# Patient Record
Sex: Female | Born: 1970
Health system: Southern US, Community
[De-identification: ages and names within clinical notes are randomized; demographics above are authoritative.]

## PROBLEM LIST (undated history)

## (undated) DIAGNOSIS — D649 Anemia, unspecified: Secondary | ICD-10-CM

## (undated) DIAGNOSIS — T7840XA Allergy, unspecified, initial encounter: Secondary | ICD-10-CM

## (undated) DIAGNOSIS — Z9109 Other allergy status, other than to drugs and biological substances: Secondary | ICD-10-CM

## (undated) DIAGNOSIS — N200 Calculus of kidney: Secondary | ICD-10-CM

## (undated) DIAGNOSIS — R768 Other specified abnormal immunological findings in serum: Secondary | ICD-10-CM

## (undated) DIAGNOSIS — G43009 Migraine without aura, not intractable, without status migrainosus: Secondary | ICD-10-CM

## (undated) DIAGNOSIS — Z862 Personal history of diseases of the blood and blood-forming organs and certain disorders involving the immune mechanism: Secondary | ICD-10-CM

## (undated) HISTORY — PX: WISDOM TOOTH EXTRACTION: SHX21

## (undated) HISTORY — DX: Migraine without aura, not intractable, without status migrainosus: G43.009

## (undated) HISTORY — DX: Anemia, unspecified: D64.9

## (undated) HISTORY — DX: Other specified abnormal immunological findings in serum: R76.8

## (undated) HISTORY — DX: Calculus of kidney: N20.0

## (undated) HISTORY — DX: Other allergy status, other than to drugs and biological substances: Z91.09

## (undated) HISTORY — DX: Personal history of diseases of the blood and blood-forming organs and certain disorders involving the immune mechanism: Z86.2

---

## 2000-11-26 ENCOUNTER — Other Ambulatory Visit: Admission: RE | Admit: 2000-11-26 | Discharge: 2000-11-26 | Payer: Self-pay | Admitting: *Deleted

## 2001-12-16 ENCOUNTER — Encounter: Payer: Self-pay | Admitting: Internal Medicine

## 2001-12-16 ENCOUNTER — Ambulatory Visit (HOSPITAL_COMMUNITY): Admission: RE | Admit: 2001-12-16 | Discharge: 2001-12-16 | Payer: Self-pay | Admitting: Internal Medicine

## 2006-11-26 ENCOUNTER — Encounter: Admission: RE | Admit: 2006-11-26 | Discharge: 2006-11-26 | Payer: Self-pay | Admitting: Internal Medicine

## 2006-12-26 ENCOUNTER — Encounter: Admission: RE | Admit: 2006-12-26 | Discharge: 2007-02-12 | Payer: Self-pay | Admitting: Internal Medicine

## 2008-01-15 ENCOUNTER — Other Ambulatory Visit: Admission: RE | Admit: 2008-01-15 | Discharge: 2008-01-15 | Payer: Self-pay | Admitting: Obstetrics and Gynecology

## 2011-11-04 ENCOUNTER — Emergency Department (HOSPITAL_BASED_OUTPATIENT_CLINIC_OR_DEPARTMENT_OTHER)
Admission: EM | Admit: 2011-11-04 | Discharge: 2011-11-04 | Disposition: A | Discharge status: Home / Self Care | Payer: BC Managed Care – PPO | Attending: Emergency Medicine | Admitting: Emergency Medicine

## 2011-11-04 ENCOUNTER — Encounter (HOSPITAL_BASED_OUTPATIENT_CLINIC_OR_DEPARTMENT_OTHER): Payer: Self-pay | Admitting: Emergency Medicine

## 2011-11-04 ENCOUNTER — Emergency Department (INDEPENDENT_AMBULATORY_CARE_PROVIDER_SITE_OTHER): Payer: BC Managed Care – PPO

## 2011-11-04 ENCOUNTER — Other Ambulatory Visit: Payer: Self-pay

## 2011-11-04 DIAGNOSIS — R079 Chest pain, unspecified: Secondary | ICD-10-CM

## 2011-11-04 DIAGNOSIS — R071 Chest pain on breathing: Secondary | ICD-10-CM | POA: Insufficient documentation

## 2011-11-04 DIAGNOSIS — R05 Cough: Secondary | ICD-10-CM

## 2011-11-04 DIAGNOSIS — R059 Cough, unspecified: Secondary | ICD-10-CM | POA: Insufficient documentation

## 2011-11-04 DIAGNOSIS — R0789 Other chest pain: Secondary | ICD-10-CM

## 2011-11-04 HISTORY — DX: Allergy, unspecified, initial encounter: T78.40XA

## 2011-11-04 MED ORDER — IBUPROFEN 800 MG PO TABS: 800.0000 mg | ORAL_TABLET | Freq: Three times a day (TID) | ORAL | Status: AC

## 2011-11-04 MED ORDER — HYDROCODONE-HOMATROPINE 5-1.5 MG/5ML PO SYRP: 2.5000 mL | ORAL_SOLUTION | Freq: Four times a day (QID) | ORAL | Status: AC | PRN

## 2011-11-04 NOTE — Discharge Instructions (Signed)
Chest Wall Pain Chest wall pain is pain in or around the bones and muscles of your chest. It may take up to 6 weeks to get better. It may take longer if you must stay physically active in your work and activities.  CAUSES  Chest wall pain may happen on its own. However, it may be caused by:  A viral illness like the flu.   Injury.   Coughing.   Exercise.   Arthritis.   Fibromyalgia.   Shingles.  HOME CARE INSTRUCTIONS   Avoid overtiring physical activity. Try not to strain or perform activities that cause pain. This includes any activities using your chest or your abdominal and side muscles, especially if heavy weights are used.   Put ice on the sore area.   Put ice in a plastic bag.   Place a towel between your skin and the bag.   Leave the ice on for 15 to 20 minutes per hour while awake for the first 2 days.   Only take over-the-counter or prescription medicines for pain, discomfort, or fever as directed by your caregiver.  SEEK IMMEDIATE MEDICAL CARE IF:   Your pain increases, or you are very uncomfortable.   You have a fever.   Your chest pain becomes worse.   You have new, unexplained symptoms.   You have nausea or vomiting.   You feel sweaty or lightheaded.   You have a cough with phlegm (sputum), or you cough up blood.  MAKE SURE YOU:   Understand these instructions.   Will watch your condition.   Will get help right away if you are not doing well or get worse.  Document Released: 07/17/2005 Document Revised: 07/06/2011 Document Reviewed: 03/13/2011 Exit[employer] Patient Information 2012 [employer]. 

## 2011-11-04 NOTE — ED Notes (Signed)
Pt c/o left sided chest pain that started after coughing. Pt states pain is worse with deep breathing. Pt reports nausea, occasional shob and tingling in hands bilaterally

## 2011-11-04 NOTE — ED Provider Notes (Signed)
History     CSN: [id number]  Arrival date & time 11/04/11  4098   First MD Initiated Contact with Patient 11/04/11 0534      Chief Complaint  Patient presents with  . Chest Pain    (Consider location/radiation/quality/duration/timing/severity/associated sxs/prior treatment) HPI The patient presents with 2 days of chest pain.  She notes that the pain began after a coughing spell.  Since onset the pain has been largely present, though it is waxing and waning.  The pain is sharp.  Pain seems to be worse with coughing and deep inspiration.  The pain is nonexertional.  The pain does not radiate.  The pain is focally about the left anterior superior chest.  The patient denies any other chest pain, abdominal pain, dyspnea, lightheadedness, syncope, extremity weakness. The patient is a nonsmoker, has not traveled recently, has no history of blood clots, nor any known coagulopathy. No history of family early cardiac death Past Medical History  Diagnosis Date  . Multiple chemical sensitivity syndrome     History reviewed. No pertinent past surgical history.  No family history on file.  History  Substance Use Topics  . Smoking status: Never Smoker   . Smokeless tobacco: Not on file  . Alcohol Use: No    OB History    Grav Para Term Preterm Abortions TAB SAB Ect Mult Living                  Review of Systems  All other systems reviewed and are negative.    Allergies  Review of patient's allergies indicates no known allergies.  Home Medications   Current Outpatient Rx  Name Route Sig Dispense Refill  . TOPIRAMATE 50 MG PO TABS Oral Take 50 mg by mouth 2 (two) times daily.      BP 129/81  Pulse 84  Temp(Src) 97.7 F (36.5 C) (Oral)  Resp 18  Ht 5\' 3"  (1.6 m)  Wt 157 lb (71.215 kg)  BMI 27.81 kg/m2  SpO2 100%  LMP 10/25/2011  Physical Exam  Nursing note and vitals reviewed. Constitutional: She is oriented to person, place, and time. She appears well-developed and  well-nourished. No distress.  HENT:  Head: Normocephalic and atraumatic.  Eyes: Conjunctivae and EOM are normal.  Cardiovascular: Normal rate and regular rhythm.   Pulmonary/Chest: Effort normal and breath sounds normal. No stridor. No respiratory distress.  Abdominal: She exhibits no distension.  Musculoskeletal: She exhibits no edema.  Neurological: She is alert and oriented to person, place, and time. No cranial nerve deficit.  Skin: Skin is warm and dry.  Psychiatric: She has a normal mood and affect.    ED Course  Procedures (including critical care time)  Labs Reviewed - No data to display No results found.   No diagnosis found.    Date: 11/04/2011  Rate: 73  Rhythm: normal sinus rhythm  QRS Axis: normal  Intervals: normal  ST/T Wave abnormalities: normal  Conduction Disutrbances:none  Narrative Interpretation:   Old EKG Reviewed: none available q waves anteriorly - borderline ecg  MDM  This generally well-appearing 41-year-old female with no cardiac risk factors, nor any thromboembolic risk factors now presents with several days of left anterior chest pain that began after a coughing spell.  The patient's description of pain following a cough is suggestive of intercostal muscle strain, though there is some suspicion for ruptured bleb.  On exam the patient is in no distress, is not hypoxic, tachypneic or tachycardic.  The absence of  any of these findings, the absence of risk factors for ACS or PE is reassuring.  The patient is low probability for either of these entities.  For the reassurance taken from the nonischemic EKG.  The patient's x-ray was unremarkable.  She was discharged in stable condition following a prolonged discussion regarding anti-inflammatory medicines, and the patient's endorsement of chemical sensitivity syndrome.        Gerhard Munch, MD 11/04/11 [phone]

## 2013-01-01 ENCOUNTER — Telehealth: Payer: Self-pay | Admitting: Obstetrics and Gynecology

## 2013-01-01 NOTE — Telephone Encounter (Signed)
Please call and let pt know why she was not scheduled with her regular doctor?

## 2013-01-02 NOTE — Telephone Encounter (Signed)
See note below per Billie Ruddy, RN . Patient aware of appointment change with Dr. Tresa Res.

## 2013-01-02 NOTE — Telephone Encounter (Signed)
Patient is on [facility] scheduled for AEX, not sure why, wants to see Dr Tresa Res.  Appointment with CR 02-11-13 at 1245.

## 2013-02-06 ENCOUNTER — Telehealth: Payer: Self-pay | Admitting: Obstetrics and Gynecology

## 2013-02-06 NOTE — Telephone Encounter (Signed)
Patient needs to have her [facility] renewed her appointment is not until the 16 th . She ran out on Saturday, needs to start back on Sunday

## 2013-02-07 ENCOUNTER — Encounter: Payer: Self-pay | Admitting: Obstetrics and Gynecology

## 2013-02-07 MED ORDER — NORETHINDRONE-ETH ESTRADIOL 0.4-35 MG-MCG PO TABS: 1.0000 | ORAL_TABLET | Freq: Every day | ORAL | Status: DC

## 2013-02-07 NOTE — Addendum Note (Signed)
Addended by: Lorraine Lax on: 02/07/2013 11:16 AM   Modules accepted: Medications

## 2013-02-07 NOTE — Telephone Encounter (Signed)
Left Message Refill Sent to Pharmacy [facility] #1/0rfs

## 2013-02-11 ENCOUNTER — Ambulatory Visit: Payer: Self-pay | Admitting: Obstetrics and Gynecology

## 2013-02-11 ENCOUNTER — Ambulatory Visit: Payer: Self-pay | Admitting: Nurse Practitioner

## 2013-02-12 ENCOUNTER — Encounter: Payer: Self-pay | Admitting: Obstetrics and Gynecology

## 2013-02-12 ENCOUNTER — Ambulatory Visit (INDEPENDENT_AMBULATORY_CARE_PROVIDER_SITE_OTHER): Payer: BC Managed Care – PPO | Admitting: Obstetrics and Gynecology

## 2013-02-12 VITALS — BP 102/64 | HR 78 | Resp 16 | Ht 63.0 in | Wt 139.0 lb

## 2013-02-12 DIAGNOSIS — R319 Hematuria, unspecified: Secondary | ICD-10-CM

## 2013-02-12 DIAGNOSIS — Z01419 Encounter for gynecological examination (general) (routine) without abnormal findings: Secondary | ICD-10-CM

## 2013-02-12 DIAGNOSIS — Z0000 Encounter for general adult medical examination without abnormal findings: Secondary | ICD-10-CM

## 2013-02-12 LAB — POCT URINALYSIS DIPSTICK
Blood, UA: 50
Protein, UA: NEGATIVE
Urobilinogen, UA: NEGATIVE

## 2013-02-12 MED ORDER — NORETHINDRONE-ETH ESTRADIOL 0.4-35 MG-MCG PO TABS: 1.0000 | ORAL_TABLET | Freq: Every day | ORAL | Status: DC

## 2013-02-12 NOTE — Progress Notes (Signed)
42 y.o.   Married    [place] American   female   G0P0000   here for annual exam.  Menses nl on OC's.  No new health issues.  Is on antibiotics for a cold.  Patient's last menstrual period was 02/04/2013.          Sexually active: yes  The current method of family planning is OCP (estrogen/progesterone).    Exercising:  occ walking Last mammogram:never  Encourage to go, list given Last pap smear:01/26/10 neg History of abnormal pap: no Smoking: never Alcohol: occ mix drink Last colonoscopy:never Last Bone Density:  never Last tetanus shot:less than 10 years Last cholesterol check: not sure  Hgb:          11.7      Urine: 50 blood   Family History  Problem Relation Age of Onset  . Diabetes Maternal Grandmother   . Heart failure Maternal Grandmother   . Ovarian cancer Paternal Grandmother   . Cervical cancer Paternal Grandmother   . Hypertension Mother   . Thyroid disease Mother     hypo    There are no active problems to display for this patient.   Past Medical History  Diagnosis Date  . Multiple chemical sensitivity syndrome   . Environmental allergies     History reviewed. No pertinent past surgical history.  Allergies: Peanuts  Current Outpatient Prescriptions  Medication Sig Dispense Refill  . amoxicillin-clavulanate (AUGMENTIN) 875-125 MG per tablet Take 1 tablet by mouth 2 (two) times daily.      . norethindrone-ethinyl estradiol (OVCON-35, 28,) 0.4-35 MG-MCG tablet Take 1 tablet by mouth daily.  1 Package  0  . topiramate (TOPAMAX) 50 MG tablet Take 50 mg by mouth 2 (two) times daily.      Marland Kitchen zolmitriptan (ZOMIG) 5 MG tablet Take 5 mg by mouth as needed for migraine.       No current facility-administered medications for this visit.    ROS: Pertinent items are noted in HPI.  Social Hx:  Married, no children, not working  Exam:    BP 102/64  Pulse 78  Resp 16  Ht 5\' 3"  (1.6 m)  Wt 139 lb (63.05 kg)  BMI 24.63 kg/m2  LMP 02/04/2013  Ht stable and wt  down 6 pounds from last year Wt Readings from Last 3 Encounters:  02/12/13 139 lb (63.05 kg)  11/04/11 157 lb (71.215 kg)     Ht Readings from Last 3 Encounters:  02/12/13 5\' 3"  (1.6 m)  11/04/11 5\' 3"  (1.6 m)    General appearance: alert, cooperative and appears stated age Head: Normocephalic, without obvious abnormality, atraumatic Neck: no adenopathy, supple, symmetrical, trachea midline and thyroid not enlarged, symmetric, no tenderness/mass/nodules Lungs: clear to auscultation bilaterally Breasts: Inspection negative, No nipple retraction or dimpling, No nipple discharge or bleeding, No axillary or supraclavicular adenopathy, Normal to palpation without dominant masses Heart: regular rate and rhythm Abdomen: soft, non-tender; bowel sounds normal; no masses,  no organomegaly Extremities: extremities normal, atraumatic, no cyanosis or edema Skin: Skin color, texture, turgor normal. No rashes or lesions Lymph nodes: Cervical, supraclavicular, and axillary nodes normal. No abnormal inguinal nodes palpated Neurologic: Grossly normal   Pelvic: External genitalia:  no lesions              Urethra:  normal appearing urethra with no masses, tenderness or lesions              Bartholins and Skenes: normal  Vagina: normal appearing vagina with normal color and discharge, no lesions              Cervix: normal appearance              Pap taken: yes        Bimanual Exam:  Uterus:  uterus is normal size, shape, consistency and nontender                                      Adnexa: normal adnexa in size, nontender and no masses                                      Rectovaginal: Confirms                                      Anus:  normal sphincter tone, no lesions  A: normal gyn exam, OC's     Multiple chemical sensitivities     Recurrent hematuria on urine dipstick with f/u microscopic exams neg - ? Related to her chemical sensitivities?     Migraine HA's on topamax      P: mammogram pap smear counseled on breast self exam, mammography screening, adequate intake of calcium and vitamin D, diet and exercise return annually or prn  Discussed the lower contraceptive effectiveness of her OC while she is on topamax, altho she is only 100mg  per day and the EPIC warning states the interference is more likely with doses of 200 mg per day or more.  I clearly told pt her OC was made less effective by the topamax, but pt thinks she will be taken off her topamax in the next 2 weeks and really wants to stay on it for now.  She and her husband are also not frequently sexually active.  Pt promises she will come back for alternative birth control if she ends up staying on her topamax.      An After Visit Summary was printed and given to the patient.

## 2013-02-12 NOTE — Patient Instructions (Signed)
EXERCISE AND DIET:  We recommended that you start or continue a regular exercise program for good health. Regular exercise means any activity that makes your heart beat faster and makes you sweat.  We recommend exercising at least 30 minutes per day at least 3 days a week, preferably 4 or 5.  We also recommend a diet low in fat and sugar.  Inactivity, poor dietary choices and obesity can cause diabetes, heart attack, stroke, and kidney damage, among others.    ALCOHOL AND SMOKING:  Women should limit their alcohol intake to no more than 7 drinks/beers/glasses of wine (combined, not each!) per week. Moderation of alcohol intake to this level decreases your risk of breast cancer and liver damage. And of course, no recreational drugs are part of a healthy lifestyle.  And absolutely no smoking or even second hand smoke. Most people know smoking can cause heart and lung diseases, but did you know it also contributes to weakening of your bones? Aging of your skin?  Yellowing of your teeth and nails?  CALCIUM AND VITAMIN D:  Adequate intake of calcium and Vitamin D are recommended.  The recommendations for exact amounts of these supplements seem to change often, but generally speaking 600 mg of calcium (either carbonate or citrate) and 800 units of Vitamin D per day seems prudent. Certain women may benefit from higher intake of Vitamin D.  If you are among these women, your doctor will have told you during your visit.    PAP SMEARS:  Pap smears, to check for cervical cancer or precancers,  have traditionally been done yearly, although recent scientific advances have shown that most women can have pap smears less often.  However, every woman still should have a physical exam from her gynecologist every year. It will include a breast check, inspection of the vulva and vagina to check for abnormal growths or skin changes, a visual exam of the cervix, and then an exam to evaluate the size and shape of the uterus and  ovaries.  And after 40 years of age, a rectal exam is indicated to check for rectal cancers. We will also provide age appropriate advice regarding health maintenance, like when you should have certain vaccines, screening for sexually transmitted diseases, bone density testing, colonoscopy, mammograms, etc.   MAMMOGRAMS:  All women over 40 years old should have a yearly mammogram. Many facilities now offer a "3D" mammogram, which may cost around $50 extra out of pocket. If possible,  we recommend you accept the option to have the 3D mammogram performed.  It both reduces the number of women who will be called back for extra views which then turn out to be normal, and it is better than the routine mammogram at detecting truly abnormal areas.       

## 2013-02-13 LAB — URINALYSIS, MICROSCOPIC ONLY

## 2013-02-13 LAB — LIPID PANEL: Cholesterol: 124 mg/dL (ref 0–200)

## 2013-02-17 LAB — IPS PAP TEST WITH HPV

## 2013-02-24 ENCOUNTER — Other Ambulatory Visit: Payer: Self-pay | Admitting: Obstetrics and Gynecology

## 2013-02-24 DIAGNOSIS — Z1231 Encounter for screening mammogram for malignant neoplasm of breast: Secondary | ICD-10-CM

## 2013-02-25 ENCOUNTER — Ambulatory Visit (HOSPITAL_COMMUNITY)
Admission: RE | Admit: 2013-02-25 | Discharge: 2013-02-25 | Disposition: A | Discharge status: Home / Self Care | Payer: BC Managed Care – PPO | Source: Ambulatory Visit | Attending: Obstetrics and Gynecology | Admitting: Obstetrics and Gynecology

## 2013-02-25 DIAGNOSIS — Z1231 Encounter for screening mammogram for malignant neoplasm of breast: Secondary | ICD-10-CM | POA: Insufficient documentation

## 2013-03-24 ENCOUNTER — Other Ambulatory Visit: Payer: Self-pay

## 2013-03-24 MED ORDER — NORETHINDRONE-ETH ESTRADIOL 0.4-35 MG-MCG PO TABS: 1.0000 | ORAL_TABLET | Freq: Every day | ORAL | Status: DC

## 2013-03-24 NOTE — Telephone Encounter (Signed)
Pt had aex 02/12/13 & given rx for 1 yr. Pharmacy is requesting 90 day supply. rx sent for supply with 3 refills

## 2014-02-17 ENCOUNTER — Ambulatory Visit (INDEPENDENT_AMBULATORY_CARE_PROVIDER_SITE_OTHER): Payer: BC Managed Care – PPO | Admitting: Certified Nurse Midwife

## 2014-02-17 ENCOUNTER — Encounter: Payer: Self-pay | Admitting: Certified Nurse Midwife

## 2014-02-17 ENCOUNTER — Ambulatory Visit: Payer: BC Managed Care – PPO | Admitting: Certified Nurse Midwife

## 2014-02-17 VITALS — BP 114/70 | HR 68 | Resp 16 | Ht 63.25 in | Wt 144.0 lb

## 2014-02-17 DIAGNOSIS — R319 Hematuria, unspecified: Secondary | ICD-10-CM

## 2014-02-17 DIAGNOSIS — Z309 Encounter for contraceptive management, unspecified: Secondary | ICD-10-CM

## 2014-02-17 DIAGNOSIS — Z0000 Encounter for general adult medical examination without abnormal findings: Secondary | ICD-10-CM

## 2014-02-17 DIAGNOSIS — Z01419 Encounter for gynecological examination (general) (routine) without abnormal findings: Secondary | ICD-10-CM

## 2014-02-17 LAB — COMPREHENSIVE METABOLIC PANEL
ALK PHOS: 31 U/L — AB (ref 39–117)
ALT: 13 U/L (ref 0–35)
AST: 13 U/L (ref 0–37)
Albumin: 3.6 g/dL (ref 3.5–5.2)
BILIRUBIN TOTAL: 0.5 mg/dL (ref 0.2–1.2)
BUN: 8 mg/dL (ref 6–23)
CO2: 21 mEq/L (ref 19–32)
Calcium: 8.9 mg/dL (ref 8.4–10.5)
Chloride: 110 mEq/L (ref 96–112)
Creat: 0.68 mg/dL (ref 0.50–1.10)
GLUCOSE: 87 mg/dL (ref 70–99)
Potassium: 3.7 mEq/L (ref 3.5–5.3)
SODIUM: 140 meq/L (ref 135–145)
Total Protein: 6.2 g/dL (ref 6.0–8.3)

## 2014-02-17 LAB — LIPID PANEL
CHOL/HDL RATIO: 2.2 ratio
CHOLESTEROL: 111 mg/dL (ref 0–200)
HDL: 51 mg/dL (ref >39)
LDL CALC: 51 mg/dL (ref 0–99)
TRIGLYCERIDES: 47 mg/dL (ref <150)
VLDL: 9 mg/dL (ref 0–40)

## 2014-02-17 LAB — POCT URINALYSIS DIPSTICK
BILIRUBIN UA: NEGATIVE
Glucose, UA: NEGATIVE
Ketones, UA: NEGATIVE
Leukocytes, UA: NEGATIVE
NITRITE UA: NEGATIVE
PH UA: 5
PROTEIN UA: NEGATIVE
Urobilinogen, UA: NEGATIVE

## 2014-02-17 LAB — HEMOGLOBIN, FINGERSTICK: Hemoglobin, fingerstick: 11.9 g/dL — ABNORMAL LOW (ref 12.0–16.0)

## 2014-02-17 MED ORDER — NORETHINDRONE-ETH ESTRADIOL 0.4-35 MG-MCG PO TABS: 1.0000 | ORAL_TABLET | Freq: Every day | ORAL | Status: DC

## 2014-02-17 NOTE — Patient Instructions (Signed)

## 2014-02-17 NOTE — Progress Notes (Signed)
43 y.o. G0P0000 Married African American Fe here for annual exam. Periods normal, no issues, except for spotting mid cycle this past month, small amount for 3 days. Patient aware of Topamax use with OCP and absorption concerns. Denies pain with intercourse or change in vaginal discharge. Patient denies any urinary frequency, pain or urgency or blood in urine. History of microscopic hematuria, but no urology work up. Patient earlier this year had MRI for back pain which proved negative. Patient fasted for labs today. Sees PCP for medication management only. Patient has noticed her bowel movements are not always regular and will have some cramping or ? Gas when occurs. No blood in stool or diarrhea. No other issues.  Patient's last menstrual period was 02/03/2014.          Sexually active: Yes.    The current method of family planning is OCP (estrogen/progesterone).    Exercising: Yes.    some walking Smoker:  no  Health Maintenance: Pap: 02-12-13 neg HPV HR neg MMG:  02-25-13 breast density category b, birads category 1: neg Colonoscopy: none BMD:   none TDaP: 2007 Labs: Poct urine-rbc 2+, Hgb-11.9 Self breast exam: done occ   reports that she has never smoked. She has never used smokeless tobacco. She reports that she does not drink alcohol or use illicit drugs.  Past Medical History  Diagnosis Date  . Multiple chemical sensitivity syndrome   . Environmental allergies   . Anemia     Past Surgical History  Procedure Laterality Date  . Wisdom tooth extraction      Current Outpatient Prescriptions  Medication Sig Dispense Refill  . montelukast (SINGULAIR) 10 MG tablet Take 10 mg by mouth. Takes 1/2 at hs      . norethindrone-ethinyl estradiol (OVCON-35, 28,) 0.4-35 MG-MCG tablet Take 1 tablet by mouth daily.  3 Package  3  . topiramate (TOPAMAX) 50 MG tablet Take 50 mg by mouth 2 (two) times daily.      Marland Kitchen zolmitriptan (ZOMIG) 5 MG tablet Take 5 mg by mouth as needed for migraine.        No current facility-administered medications for this visit.    Family History  Problem Relation Age of Onset  . Diabetes Maternal Grandmother   . Heart failure Maternal Grandmother   . Ovarian cancer Paternal Grandmother   . Cervical cancer Paternal Grandmother   . Hypertension Mother   . Thyroid disease Mother     hypo    ROS:  Pertinent items are noted in HPI.  Otherwise, a comprehensive ROS was negative.  Exam:   BP 114/70  Pulse 68  Resp 16  Ht 5' 3.25" (1.607 m)  Wt 144 lb (65.318 kg)  BMI 25.29 kg/m2  LMP 02/03/2014 Height: 5' 3.25" (160.7 cm)  Ht Readings from Last 3 Encounters:  02/17/14 5' 3.25" (1.607 m)  02/12/13 5\' 3"  (1.6 m)  11/04/11 5\' 3"  (1.6 m)    General appearance: alert, cooperative and appears stated age Head: Normocephalic, without obvious abnormality, atraumatic Neck: no adenopathy, supple, symmetrical, trachea midline and thyroid normal to inspection and palpation and non-palpable Lungs: clear to auscultation bilaterally Breasts: normal appearance, no masses or tenderness, No nipple retraction or dimpling, No nipple discharge or bleeding, No axillary or supraclavicular adenopathy Heart: regular rate and rhythm Abdomen: soft, non-tender; no masses,  no organomegaly Extremities: extremities normal, atraumatic, no cyanosis or edema Skin: Skin color, texture, turgor normal. No rashes or lesions Lymph nodes: Cervical, supraclavicular, and axillary nodes normal.  No abnormal inguinal nodes palpated Neurologic: Grossly normal   Pelvic: External genitalia:  no lesions              Urethra:  normal appearing urethra with no masses, tenderness or lesions              Bartholin's and Skene's: normal                 Vagina: normal appearing vagina with normal color and discharge, no lesions              Cervix: normal, nontender              Pap taken: No. Bimanual Exam:  Uterus:  normal size, contour, position, consistency, mobility, non-tender and  anteverted              Adnexa: normal adnexa and no mass, fullness, tenderness               Rectovaginal: Confirms               Anus:  normal sphincter tone, no lesions  A:  Well Woman with normal exam  Contraception OCP desired  R/O UTI 2+ RBC  History of back pain with negative MRI  Early 2015.  History of migraine with Topamax use.  P:   Reviewed health and wellness pertinent to exam  Rx [facility] see order, Discussed decrease in effectiveness when taken with Topamax. Patient aware but has been on for several years and has not had problems with. Discussed spotting can sometimes be a result of decrease absorption or other problems Patient will advise if continues to occur.  Aware of UTI symptoms and will advise if occurs. Discussed Urology evaluation due to history of hematuria. Will await labs, patient agreeable to referral.  Lab Urine micro/culture  Pap smear not taken today  Labs, Lipid panel , CMP, TSH, Vit. D  counseled on breast self exam, mammography screening, adequate intake of calcium and vitamin D, diet and exercise  return annually or prn  An After Visit Summary was printed and given to the patient.

## 2014-02-17 NOTE — Progress Notes (Signed)
Note reviewed, agree with plan.  Ellanora Rayborn, MD  

## 2014-02-18 LAB — URINE CULTURE: Colony Count: 70000

## 2014-02-18 LAB — URINALYSIS, MICROSCOPIC ONLY
Bacteria, UA: NONE SEEN
CRYSTALS: NONE SEEN
Casts: NONE SEEN

## 2014-02-18 LAB — TSH: TSH: 0.675 u[IU]/mL (ref 0.350–4.500)

## 2014-02-18 LAB — VITAMIN D 25 HYDROXY (VIT D DEFICIENCY, FRACTURES): Vit D, 25-Hydroxy: 40 ng/mL (ref 30–89)

## 2014-02-19 ENCOUNTER — Telehealth: Payer: Self-pay | Admitting: *Deleted

## 2014-02-19 NOTE — Telephone Encounter (Signed)
Returning a call to Kaylee Simon °

## 2014-02-19 NOTE — Telephone Encounter (Signed)
LM for pt to call back re: Lab results from 02/17/14

## 2014-02-20 NOTE — Telephone Encounter (Signed)
Patient notified see result note 

## 2014-02-24 ENCOUNTER — Telehealth: Payer: Self-pay | Admitting: Emergency Medicine

## 2014-02-24 DIAGNOSIS — R319 Hematuria, unspecified: Secondary | ICD-10-CM

## 2014-02-24 NOTE — Telephone Encounter (Signed)
Message copied by Joeseph AmorFAST, Kyri Shader L on Tue Feb 24, 2014 11:20 AM ------      Message from: Verner CholLEONARD, DEBORAH S      Created: Tue Feb 24, 2014  7:45 AM       [facility] is who I usually refer to, she will need to look or call insurance to see who is on her Network in [place].      Can we help her with this? ------

## 2014-02-24 NOTE — Telephone Encounter (Signed)
I placed referral for [facility].   Patient has a [employer] PPO plan.   Patient will need to call [employer] to ensure the provider is participating in her network.   Spoke with patient and advised she will need to call [employer] number on the back of her card to ensure what providers are covered in this area. She would like referral to [facility] since this what Verner Choleborah S. Leonard CNM has recommended. Patient will ensure she has coverage after making the appointment.   Routing to Tayl[place]abrina for referral.

## 2014-03-03 NOTE — Telephone Encounter (Signed)
Pt says nurse was going to set her up an appointment with an oncologist.

## 2014-03-04 NOTE — Telephone Encounter (Signed)
Referral sent to [facility] 03/03/14. [facility], [facility] do not have her scheduled yet.  Called patient to advise. Advised as soon as we get her appointment she will be called. She is agreeable.

## 2014-03-10 NOTE — Telephone Encounter (Signed)
Patient has appointment with [facility] for 03/23/14. Message left to return call [facility] at [phone] to ensure she is aware of appointment.

## 2015-02-04 ENCOUNTER — Other Ambulatory Visit: Payer: Self-pay | Admitting: Certified Nurse Midwife

## 2015-02-04 NOTE — Telephone Encounter (Signed)
Medication refill request: Balziva  Last AEX:  02/17/14 with DL  Next AEX: 4/09/817/27/16 with DL Last MMG (if hormonal medication request): 02/26/13 bi-rads 1; negative Refill authorized: #3 packs/0 rfs

## 2015-02-24 ENCOUNTER — Encounter: Payer: Self-pay | Admitting: Certified Nurse Midwife

## 2015-02-24 ENCOUNTER — Ambulatory Visit (INDEPENDENT_AMBULATORY_CARE_PROVIDER_SITE_OTHER): Payer: BLUE CROSS/BLUE SHIELD | Admitting: Certified Nurse Midwife

## 2015-02-24 VITALS — BP 98/62 | HR 68 | Resp 16 | Ht 63.25 in | Wt 143.0 lb

## 2015-02-24 DIAGNOSIS — Z01419 Encounter for gynecological examination (general) (routine) without abnormal findings: Secondary | ICD-10-CM | POA: Diagnosis not present

## 2015-02-24 DIAGNOSIS — Z124 Encounter for screening for malignant neoplasm of cervix: Secondary | ICD-10-CM | POA: Diagnosis not present

## 2015-02-24 DIAGNOSIS — Z0000 Encounter for general adult medical examination without abnormal findings: Secondary | ICD-10-CM | POA: Diagnosis not present

## 2015-02-24 DIAGNOSIS — Z3041 Encounter for surveillance of contraceptive pills: Secondary | ICD-10-CM | POA: Diagnosis not present

## 2015-02-24 LAB — COMPREHENSIVE METABOLIC PANEL
ALBUMIN: 3.8 g/dL (ref 3.6–5.1)
ALT: 11 U/L (ref 6–29)
AST: 13 U/L (ref 10–30)
Alkaline Phosphatase: 29 U/L — ABNORMAL LOW (ref 33–115)
BUN: 11 mg/dL (ref 7–25)
CALCIUM: 8.9 mg/dL (ref 8.6–10.2)
CO2: 21 mEq/L (ref 20–31)
Chloride: 110 mEq/L (ref 98–110)
Creat: 0.7 mg/dL (ref 0.50–1.10)
Glucose, Bld: 77 mg/dL (ref 65–99)
Potassium: 4.1 mEq/L (ref 3.5–5.3)
SODIUM: 139 meq/L (ref 135–146)
TOTAL PROTEIN: 6.5 g/dL (ref 6.1–8.1)
Total Bilirubin: 0.6 mg/dL (ref 0.2–1.2)

## 2015-02-24 LAB — CBC
HEMATOCRIT: 39.6 % (ref 36.0–46.0)
Hemoglobin: 12.9 g/dL (ref 12.0–15.0)
MCH: 28.2 pg (ref 26.0–34.0)
MCHC: 32.6 g/dL (ref 30.0–36.0)
MCV: 86.5 fL (ref 78.0–100.0)
MPV: 9.8 fL (ref 8.6–12.4)
Platelets: 306 10*3/uL (ref 150–400)
RBC: 4.58 MIL/uL (ref 3.87–5.11)
RDW: 13.9 % (ref 11.5–15.5)
WBC: 5.9 10*3/uL (ref 4.0–10.5)

## 2015-02-24 LAB — TSH: TSH: 0.606 u[IU]/mL (ref 0.350–4.500)

## 2015-02-24 MED ORDER — NORETHINDRONE-ETH ESTRADIOL 0.4-35 MG-MCG PO TABS: 1.0000 | ORAL_TABLET | Freq: Every day | ORAL | Status: DC

## 2015-02-24 NOTE — Progress Notes (Signed)
44 y.o. G0P0000 Married  African American Fe here for annual exam. Periods normal,no issues. Contraception working well. Has been in therapy for 2 frozen shoulders, improving. Also continues with multi-chemical responses with skin hives, but also has improved. Desires screening labs. No other health issues today.  Patient's last menstrual period was 01/03/2015.          Sexually active: Yes.    The current method of family planning is OCP (estrogen/progesterone).    Exercising: Yes.    walking Smoker:  no  Health Maintenance: Pap:  02-12-13 neg HPV HR neg MMG: 02-25-13 category b density,birads 1:neg Colonoscopy: none BMD:   none TDaP:  2007 Labs: hgb-12.5 Self breast exam: done monthly   reports that she has never smoked. She has never used smokeless tobacco. She reports that she does not drink alcohol or use illicit drugs.  Past Medical History  Diagnosis Date  . Multiple chemical sensitivity syndrome   . Environmental allergies   . Anemia     Past Surgical History  Procedure Laterality Date  . Wisdom tooth extraction      Current Outpatient Prescriptions  Medication Sig Dispense Refill  . BALZIVA 0.4-35 MG-MCG tablet TAKE 1 TABLET BY MOUTH DAILY 84 tablet 0  . Cyanocobalamin (VITAMIN B 12 PO) Take by mouth daily.    . montelukast (SINGULAIR) 10 MG tablet Take 10 mg by mouth. Takes 1/2 at hs    . topiramate (TOPAMAX) 50 MG tablet Take 50 mg by mouth 2 (two) times daily.    Marland Kitchen zolmitriptan (ZOMIG) 5 MG tablet Take 5 mg by mouth as needed for migraine.     No current facility-administered medications for this visit.    Family History  Problem Relation Age of Onset  . Diabetes Maternal Grandmother   . Heart failure Maternal Grandmother   . Ovarian cancer Paternal Grandmother   . Cervical cancer Paternal Grandmother   . Hypertension Mother   . Thyroid disease Mother     hypo    ROS:  Pertinent items are noted in HPI.  Otherwise, a comprehensive ROS was  negative.  Exam:   BP 98/62 mmHg  Pulse 68  Resp 16  Ht 5' 3.25" (1.607 m)  Wt 143 lb (64.864 kg)  BMI 25.12 kg/m2  LMP 01/03/2015 Height: 5' 3.25" (160.7 cm) Ht Readings from Last 3 Encounters:  02/24/15 5' 3.25" (1.607 m)  02/17/14 5' 3.25" (1.607 m)  02/12/13  (1.6 m)    General appearance: alert, cooperative and appears stated age Head: Normocephalic, without obvious abnormality, atraumatic Neck: no adenopathy, supple, symmetrical, trachea midline and thyroid normal to inspection and palpation Lungs: clear to auscultation bilaterally Breasts: normal appearance, no masses or tenderness, No nipple retraction or dimpling, No nipple discharge or bleeding, No axillary or supraclavicular adenopathy Heart: regular rate and rhythm Abdomen: soft, non-tender; no masses,  no organomegaly Extremities: extremities normal, atraumatic, no cyanosis or edema Skin: Skin color, texture, turgor normal. No rashes or lesions Lymph nodes: Cervical, supraclavicular, and axillary nodes normal. No abnormal inguinal nodes palpated Neurologic: Grossly normal   Pelvic: External genitalia:  no lesions              Urethra:  normal appearing urethra with no masses, tenderness or lesions              Bartholin's and Skene's: normal                 Vagina: normal appearing vagina with normal color  and discharge, no lesions              Cervix: normal,non tender,no lesions              Pap taken: Yes.   Bimanual Exam:  Uterus:  normal size, contour, position, consistency, mobility, non-tender              Adnexa: normal adnexa and no mass, fullness, tenderness               Rectovaginal: Confirms               Anus:  normal sphincter tone, no lesions  Chaperone present: Yes  A:  Well Woman with normal exam  Contraception desires OCP  Multichemical response history, under good control with PCP  Screening labs  Mammogram due will schedule  P:   Reviewed health and wellness pertinent to  exam  Rx Blaiziva see order  Continue follow up as indicated  Lab:TSH, CMP, CBC, Vitamin d  Pap smear taken with HPVHR   counseled on breast self exam, mammography screening, use and side effects of OCP's, adequate intake of calcium and vitamin D, diet and exercise  return annually or prn  An After Visit Summary was printed and given to the patient.  d

## 2015-02-24 NOTE — Patient Instructions (Signed)

## 2015-02-25 ENCOUNTER — Other Ambulatory Visit: Payer: Self-pay | Admitting: Certified Nurse Midwife

## 2015-02-25 DIAGNOSIS — Z1231 Encounter for screening mammogram for malignant neoplasm of breast: Secondary | ICD-10-CM

## 2015-02-25 LAB — VITAMIN D 25 HYDROXY (VIT D DEFICIENCY, FRACTURES): Vit D, 25-Hydroxy: 33 ng/mL (ref 30–100)

## 2015-02-25 NOTE — Progress Notes (Signed)
Reviewed personally.  M. Suzanne Braeson Rupe, MD.  

## 2015-02-28 LAB — IPS PAP TEST WITH HPV

## 2015-03-01 ENCOUNTER — Ambulatory Visit (HOSPITAL_COMMUNITY)
Admission: RE | Admit: 2015-03-01 | Discharge: 2015-03-01 | Disposition: A | Discharge status: Home / Self Care | Payer: BLUE CROSS/BLUE SHIELD | Source: Ambulatory Visit | Attending: Certified Nurse Midwife | Admitting: Certified Nurse Midwife

## 2015-03-01 DIAGNOSIS — Z1231 Encounter for screening mammogram for malignant neoplasm of breast: Secondary | ICD-10-CM | POA: Insufficient documentation

## 2015-04-24 ENCOUNTER — Other Ambulatory Visit: Payer: Self-pay | Admitting: Certified Nurse Midwife

## 2015-04-26 NOTE — Telephone Encounter (Signed)
Medication refill request: Balziva Last AEX:  02-24-15  Next AEX: 7-28 -17 Last MMG (if hormonal medication request): 03-01-15 WNL  Refill authorized: please advise

## 2015-10-21 DIAGNOSIS — R519 Headache, unspecified: Secondary | ICD-10-CM | POA: Insufficient documentation

## 2015-10-21 DIAGNOSIS — R51 Headache: Secondary | ICD-10-CM

## 2015-10-21 DIAGNOSIS — T7840XA Allergy, unspecified, initial encounter: Secondary | ICD-10-CM | POA: Insufficient documentation

## 2015-10-21 DIAGNOSIS — G43009 Migraine without aura, not intractable, without status migrainosus: Secondary | ICD-10-CM | POA: Insufficient documentation

## 2015-10-21 DIAGNOSIS — M542 Cervicalgia: Secondary | ICD-10-CM | POA: Insufficient documentation

## 2016-02-25 ENCOUNTER — Encounter: Payer: Self-pay | Admitting: Certified Nurse Midwife

## 2016-02-25 ENCOUNTER — Ambulatory Visit (INDEPENDENT_AMBULATORY_CARE_PROVIDER_SITE_OTHER): Payer: BLUE CROSS/BLUE SHIELD | Admitting: Certified Nurse Midwife

## 2016-02-25 ENCOUNTER — Other Ambulatory Visit: Payer: Self-pay | Admitting: Certified Nurse Midwife

## 2016-02-25 VITALS — BP 100/64 | HR 68 | Resp 16 | Ht 63.25 in | Wt 145.0 lb

## 2016-02-25 DIAGNOSIS — Z01419 Encounter for gynecological examination (general) (routine) without abnormal findings: Secondary | ICD-10-CM | POA: Diagnosis not present

## 2016-02-25 DIAGNOSIS — Z3041 Encounter for surveillance of contraceptive pills: Secondary | ICD-10-CM

## 2016-02-25 DIAGNOSIS — Z0000 Encounter for general adult medical examination without abnormal findings: Secondary | ICD-10-CM

## 2016-02-25 DIAGNOSIS — N390 Urinary tract infection, site not specified: Secondary | ICD-10-CM

## 2016-02-25 LAB — POCT URINALYSIS DIPSTICK
BILIRUBIN UA: NEGATIVE
GLUCOSE UA: NEGATIVE
Ketones, UA: NEGATIVE
NITRITE UA: NEGATIVE
Protein, UA: NEGATIVE
UROBILINOGEN UA: NEGATIVE
pH, UA: 5

## 2016-02-25 LAB — HEMOGLOBIN, FINGERSTICK: HEMOGLOBIN, FINGERSTICK: 12.7 g/dL (ref 12.0–16.0)

## 2016-02-25 MED ORDER — NORETHINDRONE-ETH ESTRADIOL 0.4-35 MG-MCG PO TABS: 1.0000 | ORAL_TABLET | Freq: Every day | ORAL | 4 refills | Status: DC

## 2016-02-25 MED ORDER — NITROFURANTOIN MONOHYD MACRO 100 MG PO CAPS: 100.0000 mg | ORAL_CAPSULE | Freq: Two times a day (BID) | ORAL | 0 refills | Status: DC

## 2016-02-25 MED ORDER — PHENAZOPYRIDINE HCL 100 MG PO TABS: 100.0000 mg | ORAL_TABLET | Freq: Three times a day (TID) | ORAL | 0 refills | Status: DC | PRN

## 2016-02-25 NOTE — Patient Instructions (Signed)
EXERCISE AND DIET:  We recommended that you start or continue a regular exercise program for good health. Regular exercise means any activity that makes your heart beat faster and makes you sweat.  We recommend exercising at least 30 minutes per day at least 3 days a week, preferably 4 or 5.  We also recommend a diet low in fat and sugar.  Inactivity, poor dietary choices and obesity can cause diabetes, heart attack, stroke, and kidney damage, among others.    ALCOHOL AND SMOKING:  Women should limit their alcohol intake to no more than 7 drinks/beers/glasses of wine (combined, not each!) per week. Moderation of alcohol intake to this level decreases your risk of breast cancer and liver damage. And of course, no recreational drugs are part of a healthy lifestyle.  And absolutely no smoking or even second hand smoke. Most people know smoking can cause heart and lung diseases, but did you know it also contributes to weakening of your bones? Aging of your skin?  Yellowing of your teeth and nails?  CALCIUM AND VITAMIN D:  Adequate intake of calcium and Vitamin D are recommended.  The recommendations for exact amounts of these supplements seem to change often, but generally speaking 600 mg of calcium (either carbonate or citrate) and 800 units of Vitamin D per day seems prudent. Certain women may benefit from higher intake of Vitamin D.  If you are among these women, your doctor will have told you during your visit.    PAP SMEARS:  Pap smears, to check for cervical cancer or precancers,  have traditionally been done yearly, although recent scientific advances have shown that most women can have pap smears less often.  However, every woman still should have a physical exam from her gynecologist every year. It will include a breast check, inspection of the vulva and vagina to check for abnormal growths or skin changes, a visual exam of the cervix, and then an exam to evaluate the size and shape of the uterus and  ovaries.  And after 44 years of age, a rectal exam is indicated to check for rectal cancers. We will also provide age appropriate advice regarding health maintenance, like when you should have certain vaccines, screening for sexually transmitted diseases, bone density testing, colonoscopy, mammograms, etc.   MAMMOGRAMS:  All women over 40 years old should have a yearly mammogram. Many facilities now offer a "3D" mammogram, which may cost around $50 extra out of pocket. If possible,  we recommend you accept the option to have the 3D mammogram performed.  It both reduces the number of women who will be called back for extra views which then turn out to be normal, and it is better than the routine mammogram at detecting truly abnormal areas.    COLONOSCOPY:  Colonoscopy to screen for colon cancer is recommended for all women at age 50.  We know, you hate the idea of the prep.  We agree, BUT, having colon cancer and not knowing it is worse!!  Colon cancer so often starts as a polyp that can be seen and removed at colonscopy, which can quite literally save your life!  And if your first colonoscopy is normal and you have no family history of colon cancer, most women don't have to have it again for 10 years.  Once every ten years, you can do something that may end up saving your life, right?  We will be happy to help you get it scheduled when you are ready.    Be sure to check your insurance coverage so you understand how much it will cost.  It may be covered as a preventative service at no cost, but you should check your particular policy.     Urinary Tract Infection Urinary tract infections (UTIs) can develop anywhere along your urinary tract. Your urinary tract is your body's drainage system for removing wastes and extra water. Your urinary tract includes two kidneys, two ureters, a bladder, and a urethra. Your kidneys are a pair of bean-shaped organs. Each kidney is about the size of your fist. They are located  below your ribs, one on each side of your spine. CAUSES Infections are caused by microbes, which are microscopic organisms, including fungi, viruses, and bacteria. These organisms are so small that they can only be seen through a microscope. Bacteria are the microbes that most commonly cause UTIs. SYMPTOMS  Symptoms of UTIs may vary by age and gender of the patient and by the location of the infection. Symptoms in young women typically include a frequent and intense urge to urinate and a painful, burning feeling in the bladder or urethra during urination. Older women and men are more likely to be tired, shaky, and weak and have muscle aches and abdominal pain. A fever may mean the infection is in your kidneys. Other symptoms of a kidney infection include pain in your back or sides below the ribs, nausea, and vomiting. DIAGNOSIS To diagnose a UTI, your caregiver will ask you about your symptoms. Your caregiver will also ask you to provide a urine sample. The urine sample will be tested for bacteria and white blood cells. White blood cells are made by your body to help fight infection. TREATMENT  Typically, UTIs can be treated with medication. Because most UTIs are caused by a bacterial infection, they usually can be treated with the use of antibiotics. The choice of antibiotic and length of treatment depend on your symptoms and the type of bacteria causing your infection. HOME CARE INSTRUCTIONS  If you were prescribed antibiotics, take them exactly as your caregiver instructs you. Finish the medication even if you feel better after you have only taken some of the medication.  Drink enough water and fluids to keep your urine clear or pale yellow.  Avoid caffeine, tea, and carbonated beverages. They tend to irritate your bladder.  Empty your bladder often. Avoid holding urine for long periods of time.  Empty your bladder before and after sexual intercourse.  After a bowel movement, women should  cleanse from front to back. Use each tissue only once. SEEK MEDICAL CARE IF:   You have back pain.  You develop a fever.  Your symptoms do not begin to resolve within 3 days. SEEK IMMEDIATE MEDICAL CARE IF:   You have severe back pain or lower abdominal pain.  You develop chills.  You have nausea or vomiting.  You have continued burning or discomfort with urination. MAKE SURE YOU:   Understand these instructions.  Will watch your condition.  Will get help right away if you are not doing well or get worse.   This information is not intended to replace advice given to you by your health care provider. Make sure you discuss any questions you have with your health care provider.   Document Released: 04/26/2005 Document Revised: 04/07/2015 Document Reviewed: 08/25/2011 Elsevier Interactive Patient Education 2016 [employer]  

## 2016-02-25 NOTE — Progress Notes (Addendum)
45 y.o. G0P0000 Married African American Fe here for annual exam. Periods normal, no issues. Contraception working well. Patient had episode of severe pain one week with vomiting, lasted for several days.Severe pain has resolved. Now has urinary frequency and urgency, no pain with urination. No fever or chills. Having some back pain now. Has increased water intake. ? Stone passed. No blood in urine. Urine is slightly darker. Felt may she may have UTI.  Sees Dr. Piedad Climes yearly for labs and aex. Sees Neurologist for management of migraine headaches, no aura. All medication stable.  LMP:02-01-16        Sexually active: Yes.    The current method of family planning is OCP (estrogen/progesterone).    Exercising: No.  exercise Smoker:  no  Health Maintenance: Pap:  02-24-15 neg HPV HR neg MMG:  03-01-15 category c density birads 1:neg Colonoscopy:  5/17 neg  BMD:   none TDaP:  2007 Shingles: no Pneumonia: no Hep C and HIV: not done Labs: poct urine-hgb-12.7, poct urine-rbc tr, wbc tr Self breast exam: done monthly   reports that she has never smoked. She has never used smokeless tobacco. She reports that she does not drink alcohol or use drugs.  Past Medical History:  Diagnosis Date  . Anemia   . Environmental allergies   . Multiple chemical sensitivity syndrome     Past Surgical History:  Procedure Laterality Date  . WISDOM TOOTH EXTRACTION      Current Outpatient Prescriptions  Medication Sig Dispense Refill  . BALZIVA 0.4-35 MG-MCG tablet TAKE 1 TABLET BY MOUTH DAILY 84 tablet 4  . Cyanocobalamin (VITAMIN B 12 PO) Take by mouth daily.    . montelukast (SINGULAIR) 10 MG tablet Take 10 mg by mouth. Takes 1/2 at hs    . topiramate (TOPAMAX) 50 MG tablet Take 50 mg by mouth 2 (two) times daily.    Marland Kitchen zolmitriptan (ZOMIG) 5 MG tablet Take 5 mg by mouth as needed for migraine.     No current facility-administered medications for this visit.     Family History  Problem Relation Age of  Onset  . Diabetes Maternal Grandmother   . Heart failure Maternal Grandmother   . Ovarian cancer Paternal Grandmother   . Cervical cancer Paternal Grandmother   . Hypertension Mother   . Thyroid disease Mother     hypo    ROS:  Pertinent items are noted in HPI.  Otherwise, a comprehensive ROS was negative.  Exam:   There were no vitals taken for this visit.   Ht Readings from Last 3 Encounters:  02/24/15 5' 3.25" (1.607 m)  02/17/14 5' 3.25" (1.607 m)  02/12/13  (1.6 m)    General appearance: alert, cooperative and appears stated age Head: Normocephalic, without obvious abnormality, atraumatic Neck: no adenopathy, supple, symmetrical, trachea midline and thyroid normal to inspection and palpation Lungs: clear to auscultation bilaterally CVAT: negative bilateral Breasts: normal appearance, no masses or tenderness, No nipple retraction or dimpling, No nipple discharge or bleeding, No axillary or supraclavicular adenopathy Heart: regular rate and rhythm Abdomen: soft, non-tender; no masses,  no organomegaly, positive suprapubic Extremities: extremities normal, atraumatic, no cyanosis or edema Skin: Skin color, texture, turgor normal. No rashes or lesions Lymph nodes: Cervical, supraclavicular, and axillary nodes normal. No abnormal inguinal nodes palpated Neurologic: Grossly normal   Pelvic: External genitalia:  no lesions              Urethra:  normal appearing urethra with no masses,  tender, no lesions  Bladder and urethral meatus tender              Bartholin's and Skene's: normal                 Vagina: normal appearing vagina with normal color and discharge, no lesions              Cervix: no bleeding following Pap, no cervical motion tenderness and no lesions              Pap taken: No. Bimanual Exam:  Uterus:  normal size, contour, position, consistency, mobility, non-tender              Adnexa: normal adnexa and no mass, fullness, tenderness                Rectovaginal: Confirms               Anus:  normal sphincter tone, no lesions  Chaperone present: yes  A:  Well Woman with normal exam  Contraception OCP desired  UTI  Migraine headache no aura with management with Neurology  Immunization update  P:   Reviewed health and wellness pertinent to exam  Rx Maryanna Shape see order with instructions  Discussed UTI finding and need for treatment. Warning signs of UTI given. Increase water intake.  Rx Macrobid see order Rx Pyridium see order Lab: Urine micro and culture  Continue follow up with MD as indicated  Will do TDAP at PCP  Pap smear as above not taken   counseled on breast self exam, mammography screening, use and side effects of OCP's, adequate intake of calcium and vitamin D, diet and exercise  return annually or prn  An After Visit Summary was printed and given to the patient.

## 2016-02-26 LAB — URINALYSIS, MICROSCOPIC ONLY
Bacteria, UA: NONE SEEN [HPF]
CASTS: NONE SEEN [LPF]
CRYSTALS: NONE SEEN [HPF]
Yeast: NONE SEEN [HPF]

## 2016-02-27 LAB — URINALYSIS, MICROSCOPIC ONLY

## 2016-02-27 LAB — URINE CULTURE

## 2016-02-28 NOTE — Progress Notes (Signed)
Encounter reviewed Please confirm migraines are without aura. Gertie Exon, MD

## 2016-05-25 DIAGNOSIS — G44209 Tension-type headache, unspecified, not intractable: Secondary | ICD-10-CM | POA: Insufficient documentation

## 2017-02-27 ENCOUNTER — Encounter: Payer: Self-pay | Admitting: Certified Nurse Midwife

## 2017-02-27 ENCOUNTER — Ambulatory Visit (INDEPENDENT_AMBULATORY_CARE_PROVIDER_SITE_OTHER): Payer: BLUE CROSS/BLUE SHIELD | Admitting: Certified Nurse Midwife

## 2017-02-27 ENCOUNTER — Other Ambulatory Visit: Payer: Self-pay | Admitting: Certified Nurse Midwife

## 2017-02-27 ENCOUNTER — Other Ambulatory Visit (HOSPITAL_COMMUNITY)
Admission: RE | Admit: 2017-02-27 | Discharge: 2017-02-27 | Disposition: A | Discharge status: Home / Self Care | Payer: BLUE CROSS/BLUE SHIELD | Source: Ambulatory Visit | Attending: Obstetrics & Gynecology | Admitting: Obstetrics & Gynecology

## 2017-02-27 VITALS — BP 116/80 | HR 64 | Ht 63.25 in | Wt 152.0 lb

## 2017-02-27 DIAGNOSIS — Z124 Encounter for screening for malignant neoplasm of cervix: Secondary | ICD-10-CM | POA: Diagnosis not present

## 2017-02-27 DIAGNOSIS — Z23 Encounter for immunization: Secondary | ICD-10-CM | POA: Diagnosis not present

## 2017-02-27 DIAGNOSIS — Z01419 Encounter for gynecological examination (general) (routine) without abnormal findings: Secondary | ICD-10-CM | POA: Insufficient documentation

## 2017-02-27 DIAGNOSIS — Z3041 Encounter for surveillance of contraceptive pills: Secondary | ICD-10-CM

## 2017-02-27 DIAGNOSIS — Z1231 Encounter for screening mammogram for malignant neoplasm of breast: Secondary | ICD-10-CM

## 2017-02-27 NOTE — Progress Notes (Signed)
46 y.o. G0P0000 Married  African American Fe here for annual exam. Periods for the past few months starts on one day later, with lighter flow. Contraception working well. Stools changing to being as often. Constipation occasional, has changed diet to salads and has stopped all bread use. Has noticed a change in stool since change. Slightly brown orange in color once. Denies black tarry or blood in stools.. Taking gluten out of diet with some change. Eating lots of greens.  Sees Neurology for medication management of Topamax daily and Zomig. No other health issues today.   Patient's last menstrual period was 01/31/2017 (exact date).          Sexually active: Yes.    The current method of family planning is OCP (estrogen/progesterone).    Exercising: Yes.    walking Smoker:  no  Health Maintenance: Pap: 02/24/15, Negative with neg HR HPV  02/12/13, Negative with neg HR HPV History of Abnormal Pap: no MMG: 03/01/15, Density Category C, Bi-Rads 1:  Negative Self Breast exams: yes Colonoscopy: 11/2015, Negative TDaP: 07/31/05 Shingles: none Pneumonia: none Hep C and HIV: not done Labs: if needed   reports that she has never smoked. She has never used smokeless tobacco. She reports that she does not drink alcohol or use drugs.  Past Medical History:  Diagnosis Date  . Anemia   . Environmental allergies   . Multiple chemical sensitivity syndrome     Past Surgical History:  Procedure Laterality Date  . WISDOM TOOTH EXTRACTION      Current Outpatient Prescriptions  Medication Sig Dispense Refill  . Cyanocobalamin (VITAMIN B 12 PO) Take by mouth daily.    . Ergocalciferol (VITAMIN D2) 400 units TABS Take by mouth.    . Misc Natural Products (DANDELION ROOT PO) Take by mouth daily.    . montelukast (SINGULAIR) 10 MG tablet Take 10 mg by mouth. Takes 1/2 at hs    . norethindrone-ethinyl estradiol (BALZIVA) 0.4-35 MG-MCG tablet Take 1 tablet by mouth daily. 84 tablet 4  . topiramate (TOPAMAX)  50 MG tablet Take 50 mg by mouth 2 (two) times daily.    Marland Kitchen. VITAMIN E PO Take by mouth.    . zolmitriptan (ZOMIG) 5 MG tablet Take 5 mg by mouth as needed for migraine.     No current facility-administered medications for this visit.     Family History  Problem Relation Age of Onset  . Diabetes Maternal Grandmother   . Heart failure Maternal Grandmother   . Ovarian cancer Paternal Grandmother   . Cervical cancer Paternal Grandmother   . Hypertension Mother   . Thyroid disease Mother        hypo  . Heart attack Cousin     ROS:  Pertinent items are noted in HPI.  Otherwise, a comprehensive ROS was negative.  Exam:   BP 116/80 (BP Location: Right Arm, Patient Position: Sitting, Cuff Size: Normal)   Pulse 64   Ht 5' 3.25" (1.607 m)   Wt 152 lb (68.9 kg)   LMP 01/31/2017 (Exact Date)   BMI 26.71 kg/m  Height: 5' 3.25" (160.7 cm) Ht Readings from Last 3 Encounters:  02/27/17 5' 3.25" (1.607 m)  02/25/16 5' 3.25" (1.607 m)  02/24/15 5' 3.25" (1.607 m)    General appearance: alert, cooperative and appears stated age Head: Normocephalic, without obvious abnormality, atraumatic Neck: no adenopathy, supple, symmetrical, trachea midline and thyroid normal to inspection and palpation Lungs: clear to auscultation bilaterally Breasts: normal appearance, no masses  or tenderness, No nipple retraction or dimpling, No nipple discharge or bleeding, No axillary or supraclavicular adenopathy Heart: regular rate and rhythm Abdomen: soft, non-tender; no masses,  no organomegaly Extremities: extremities normal, atraumatic, no cyanosis or edema Skin: Skin color, texture, turgor normal. No rashes or lesions Lymph nodes: Cervical, supraclavicular, and axillary nodes normal. No abnormal inguinal nodes palpated Neurologic: Grossly normal   Pelvic: External genitalia:  no lesions              Urethra:  normal appearing urethra with no masses, tenderness or lesions              Bartholin's and  Skene's: normal                 Vagina: normal appearing vagina with normal color and discharge, no lesions              Cervix: no bleeding following Pap, no cervical motion tenderness and no lesions              Pap taken: Yes.   Bimanual Exam:  Uterus:  normal size, contour, position, consistency, mobility, non-tender              Adnexa: normal adnexa and no mass, fullness, tenderness               Rectovaginal: Confirms               Anus:  normal sphincter tone, no lesions  Chaperone present: yes  A:  Well Woman with normal exam  Contraception OCP desired  Migraine without aura with Neurology management  Family history of cervical cancer, late age PGM  Immunization update  P:   Reviewed health and wellness pertinent to exam  Rx Maryanna ShapeBalziva see order with instructions  Reviewed warning signs with migraine and need to advise if occurs  Request TDAP  Pap smear: yes   counseled on breast self exam, mammography screening, use and side effects of OCP's, adequate intake of calcium and vitamin D, diet and exercise  Rv aex, prn  An After Visit Summary was printed and given to the patient.

## 2017-02-27 NOTE — Patient Instructions (Signed)

## 2017-02-28 LAB — CYTOLOGY - PAP: DIAGNOSIS: NEGATIVE

## 2017-03-05 ENCOUNTER — Ambulatory Visit
Admission: RE | Admit: 2017-03-05 | Discharge: 2017-03-05 | Disposition: A | Discharge status: Home / Self Care | Payer: BLUE CROSS/BLUE SHIELD | Source: Ambulatory Visit | Attending: Certified Nurse Midwife | Admitting: Certified Nurse Midwife

## 2017-03-05 DIAGNOSIS — Z1231 Encounter for screening mammogram for malignant neoplasm of breast: Secondary | ICD-10-CM

## 2017-03-06 ENCOUNTER — Telehealth: Payer: Self-pay | Admitting: Certified Nurse Midwife

## 2017-03-06 ENCOUNTER — Other Ambulatory Visit: Payer: Self-pay | Admitting: Certified Nurse Midwife

## 2017-03-06 DIAGNOSIS — Z1231 Encounter for screening mammogram for malignant neoplasm of breast: Secondary | ICD-10-CM

## 2017-03-06 DIAGNOSIS — N6459 Other signs and symptoms in breast: Secondary | ICD-10-CM

## 2017-03-06 NOTE — Telephone Encounter (Signed)
Patient went for mammogram yesterday and they stopped and told her to call Kaylee Simon she needs a diagnostic mammogram.  Patient states to call her if you need additional information.

## 2017-03-06 NOTE — Telephone Encounter (Signed)
Patient called again about the message from this morning

## 2017-03-06 NOTE — Telephone Encounter (Signed)
[facility]oke with the [facility] who states that the patient came in for screening mammogram and mentioned concerns with left breast thickening. Screening was not performed and patient was advised to contact our office for diagnostic imaging orders.  Spoke with patient. Last aex was 02/27/17. Reports she had a breast check and everything was normal. States she received TDAP that day and after felt slight chest discomfort close to her left breast. Has been palpating the area and comparing it to her right side. Feels they are the same, but that she has made the area tender by pressing on it so often. Patient states "I wanted to follow protocol and see what Leota Sauerseborah Leonard CNM wants me to do since I just had a breast check."

## 2017-03-07 ENCOUNTER — Other Ambulatory Visit: Payer: Self-pay | Admitting: Certified Nurse Midwife

## 2017-03-07 DIAGNOSIS — Z1231 Encounter for screening mammogram for malignant neoplasm of breast: Secondary | ICD-10-CM

## 2017-03-07 NOTE — Telephone Encounter (Signed)
Order placed for bilateral 3D screening mammogram at the [facility]. Call to the patient. Advised of recommendations. Patient is agreeable.   Call to the [facility]. Appointment for 3D bilateral mammogram scheduled for 03/13/17 at 8:20 am. Spoke with patient who is agreeable to appointment date and time.  Routing to provider for final review. Patient agreeable to disposition. Will close encounter.

## 2017-03-07 NOTE — Telephone Encounter (Signed)
She should have regular 3 D screening after reading her reply and will agree to diagnostic if screening abnormal

## 2017-03-13 ENCOUNTER — Ambulatory Visit
Admission: RE | Admit: 2017-03-13 | Discharge: 2017-03-13 | Disposition: A | Discharge status: Home / Self Care | Payer: BLUE CROSS/BLUE SHIELD | Source: Ambulatory Visit | Attending: Certified Nurse Midwife | Admitting: Certified Nurse Midwife

## 2017-03-13 DIAGNOSIS — Z1231 Encounter for screening mammogram for malignant neoplasm of breast: Secondary | ICD-10-CM

## 2017-05-16 ENCOUNTER — Other Ambulatory Visit: Payer: Self-pay | Admitting: Certified Nurse Midwife

## 2017-05-16 DIAGNOSIS — Z3041 Encounter for surveillance of contraceptive pills: Secondary | ICD-10-CM

## 2017-05-16 NOTE — Telephone Encounter (Signed)
Medication refill request: OCP Last AEX:  02/27/17 DL Next AEX: 6/9/628/2/19 DL Last MMG (if hormonal medication request): 03/13/17 BIRADS1:neg  Refill authorized: 02/25/16 #[id number]. Today please advise.

## 2018-01-17 ENCOUNTER — Encounter: Payer: Self-pay | Admitting: Obstetrics and Gynecology

## 2018-01-17 ENCOUNTER — Other Ambulatory Visit: Payer: Self-pay | Admitting: Certified Nurse Midwife

## 2018-01-17 DIAGNOSIS — Z3041 Encounter for surveillance of contraceptive pills: Secondary | ICD-10-CM

## 2018-01-17 NOTE — Telephone Encounter (Signed)
Medication refill request: balziva Last AEX:  02-27-17 Next AEX: 03-01-18 Last MMG (if hormonal medication request): 03-13-17 category b density birads 1:neg Refill authorized: pt needs refill to get her to her aex. Please approve.

## 2018-02-08 ENCOUNTER — Other Ambulatory Visit: Payer: Self-pay | Admitting: Certified Nurse Midwife

## 2018-02-08 ENCOUNTER — Telehealth: Payer: Self-pay | Admitting: Certified Nurse Midwife

## 2018-02-08 DIAGNOSIS — Z1231 Encounter for screening mammogram for malignant neoplasm of breast: Secondary | ICD-10-CM

## 2018-02-08 NOTE — Telephone Encounter (Signed)
Spoke with patient. Patient asking if MMG "mandantory" or "recommmended"  Advised patient yearly MMG recommended for screening of breast cancer. Advised will need updated MMG to continue refills of OCP or HRT. Patient verbalizes understanding and is agreeable.   Routing to provider for final review. Patient is agreeable to disposition. Will close encounter.

## 2018-02-08 NOTE — Telephone Encounter (Signed)
Left message to call Mayuri Staples at [phone].  

## 2018-02-08 NOTE — Telephone Encounter (Signed)
Patient called requesting to speak with the nurse. Patient aware a screening mammogram was recommended for this year after her last mammogram. She said, "I want to know if it's mandatory, not just recommended, that I have my mammogram this year."

## 2018-02-28 ENCOUNTER — Encounter

## 2018-02-28 ENCOUNTER — Ambulatory Visit: Payer: BLUE CROSS/BLUE SHIELD | Admitting: Certified Nurse Midwife

## 2018-03-01 ENCOUNTER — Encounter: Payer: Self-pay | Admitting: Certified Nurse Midwife

## 2018-03-01 ENCOUNTER — Encounter

## 2018-03-01 ENCOUNTER — Ambulatory Visit: Payer: BLUE CROSS/BLUE SHIELD | Admitting: Certified Nurse Midwife

## 2018-03-01 VITALS — BP 118/70 | HR 72 | Ht 63.0 in | Wt 150.8 lb

## 2018-03-01 DIAGNOSIS — Z3041 Encounter for surveillance of contraceptive pills: Secondary | ICD-10-CM | POA: Diagnosis not present

## 2018-03-01 DIAGNOSIS — Z01419 Encounter for gynecological examination (general) (routine) without abnormal findings: Secondary | ICD-10-CM | POA: Diagnosis not present

## 2018-03-01 MED ORDER — NORETHINDRONE-ETH ESTRADIOL 0.4-35 MG-MCG PO TABS: 1.0000 | ORAL_TABLET | Freq: Every day | ORAL | 4 refills | Status: DC

## 2018-03-01 NOTE — Patient Instructions (Signed)

## 2018-03-01 NOTE — Progress Notes (Signed)
47 y.o. G0P0000 Married  African American Fe here for annual exam. Periods have been normal, but starting a day late, but had missed a few pills. She is now track. No warning signs with OCP use. Period was normal in amount. History of Migraine no aura ever, continues with Neurology follow up no changes.Sees PCP  [facility]bright yearly, and labs, all normal. No health changes in past year. Planning a vacation one day!  No LMP recorded.          Sexually active: Yes.    The current method of family planning is Rx. Balziva  Exercising: No.   Smoker:  no  Review of Systems  Constitutional: Negative.   Eyes: Negative.   Respiratory: Negative.   Cardiovascular: Positive for leg swelling.  Gastrointestinal: Negative.   Genitourinary: Negative for frequency.       Menstrual cycle change  Musculoskeletal: Negative.   Skin: Positive for itching.  Neurological: Negative.   Endo/Heme/Allergies:       Heat/cold intolerance  Psychiatric/Behavioral: Negative.     Health Maintenance: Pap:  02-24-15 neg HPV HR neg, 02-27-17 neg History of Abnormal Pap: no MMG:  03-13-17 category b density birads 1:neg next appt 02/2018 Self Breast exams: yes Colonoscopy:  2017 neg BMD:   N/A TDaP:  2018 Shingles: N/A Pneumonia: n/a Hep C and HIV:  25 years ago per pt Labs: done by PCP 07/2017  reports that she has never smoked. She has never used smokeless tobacco. She reports that she does not drink alcohol or use drugs.  Past Medical History:  Diagnosis Date  . Anemia   . Environmental allergies   . Migraine without aura   . Multiple chemical sensitivity syndrome     Past Surgical History:  Procedure Laterality Date  . WISDOM TOOTH EXTRACTION      Current Outpatient Medications  Medication Sig Dispense Refill  . BALZIVA 0.4-35 MG-MCG tablet TAKE 1 TABLET BY MOUTH DAILY. 84 tablet 0  . Cyanocobalamin (VITAMIN B 12 PO) Take by mouth daily.    . Ergocalciferol (VITAMIN D2) 400 units TABS Take by mouth.     . Misc Natural Products (DANDELION ROOT PO) Take by mouth daily.    . montelukast (SINGULAIR) 10 MG tablet Take 10 mg by mouth. Takes 1/2 at hs    . topiramate (TOPAMAX) 50 MG tablet Take 50 mg by mouth 2 (two) times daily.    Marland Kitchen. VITAMIN E PO Take by mouth.    . zolmitriptan (ZOMIG) 5 MG tablet Take 5 mg by mouth as needed for migraine.     No current facility-administered medications for this visit.     Family History  Problem Relation Age of Onset  . Diabetes Maternal Grandmother   . Heart failure Maternal Grandmother   . Ovarian cancer Paternal Grandmother   . Cervical cancer Paternal Grandmother   . Hypertension Mother   . Thyroid disease Mother        hypo  . Heart attack Cousin     ROS:  Pertinent items are noted in HPI.  Otherwise, a comprehensive ROS was negative.  Exam:   There were no vitals taken for this visit.   Ht Readings from Last 3 Encounters:  02/27/17 5' 3.25" (1.607 m)  02/25/16 5' 3.25" (1.607 m)  02/24/15 5' 3.25" (1.607 m)    General appearance: alert, cooperative and appears stated age Head: Normocephalic, without obvious abnormality, atraumatic Neck: no adenopathy, supple, symmetrical, trachea midline and thyroid normal to inspection and  palpation Lungs: clear to auscultation bilaterally Breasts: normal appearance, no masses or tenderness, No nipple retraction or dimpling, No nipple discharge or bleeding, No axillary or supraclavicular adenopathy Heart: regular rate and rhythm Abdomen: soft, non-tender; no masses,  no organomegaly Extremities: extremities normal, atraumatic, no cyanosis or edema Skin: Skin color, texture, turgor normal. No rashes or lesions Lymph nodes: Cervical, supraclavicular, and axillary nodes normal. No abnormal inguinal nodes palpated Neurologic: Grossly normal   Pelvic: External genitalia:  no lesions              Urethra:  normal appearing urethra with no masses, tenderness or lesions              Bartholin's and  Skene's: normal                 Vagina: normal appearing vagina with normal color and discharge, no lesions              Cervix: no cervical motion tenderness, no lesions and normal appearance              Pap taken: No. Bimanual Exam:  Uterus:  normal size, contour, position, consistency, mobility, non-tender and anteverted              Adnexa: normal adnexa and no mass, fullness, tenderness               Rectovaginal: Confirms               Anus:  normal sphincter tone, no lesions  Chaperone present: yes  A:  Well Woman with normal exam  Contraception OCP desired  Migraine headache without aura with Neurology management, no change  P:   Reviewed health and wellness pertinent to exam  Reviewed risks/benefits/warning signs with OCP and if migraines change  Or have aura will need to switch to POP. Patient aware.  Rx Maryanna Shape see order with instructions  Continue follow up with PCP and neurology as indicated  Pap smear: no   counseled on breast self exam, mammography screening, use and side effects of OCP's, adequate intake of calcium and vitamin D, diet and exercise  return annually or prn  An After Visit Summary was printed and given to the patient.

## 2018-03-19 ENCOUNTER — Ambulatory Visit
Admission: RE | Admit: 2018-03-19 | Discharge: 2018-03-19 | Disposition: A | Discharge status: Home / Self Care | Payer: BLUE CROSS/BLUE SHIELD | Source: Ambulatory Visit | Attending: Certified Nurse Midwife | Admitting: Certified Nurse Midwife

## 2018-03-19 DIAGNOSIS — Z1231 Encounter for screening mammogram for malignant neoplasm of breast: Secondary | ICD-10-CM

## 2018-04-08 ENCOUNTER — Telehealth: Payer: Self-pay | Admitting: Certified Nurse Midwife

## 2018-04-08 NOTE — Telephone Encounter (Signed)
Left message to call Lenvil Swaim at [phone].  

## 2018-04-08 NOTE — Telephone Encounter (Signed)
Spoke with patient. Patient reports lower right side that goes to her back and urinary frequency for 3 wks.Uses a resuable douche bag when traveling, symptoms started after using it last on vacation. Seeing chiropractor for lower back pain, pain is improving. LMP approximately 2 wks ago, OCP for contraceptive.   Patient states symptoms are exactly the same as when she "was treated 1-2nyrs ago for the same pain". Requesting Rx.   Denies fever/chills, N/V, blood in urine, vaginal d/c or odor.   Recommended OV for further evaluation. Patient declined OV for today with covering provider. OV scheduled for 9/10 at 2pm with Leota Sauers, CNM.   Routing to covering provider for final review. Patient is agreeable to disposition. Will close encounter.   Cc: Leota Sauers, CNM

## 2018-04-08 NOTE — Telephone Encounter (Signed)
Patient stated that she is having the "same problem she was having about a year ago." Patient is experiencing right side pain. Patient is requesting the same medication that was used when she had these symptoms previously. Patient does not remember name of medication. Patient stated that it was "for an infection." Patient can be reached at [phone] until 11:30am and then at [phone] after 11:30am.

## 2018-04-09 ENCOUNTER — Ambulatory Visit (INDEPENDENT_AMBULATORY_CARE_PROVIDER_SITE_OTHER): Payer: BLUE CROSS/BLUE SHIELD | Admitting: Certified Nurse Midwife

## 2018-04-09 ENCOUNTER — Encounter: Payer: Self-pay | Admitting: Certified Nurse Midwife

## 2018-04-09 ENCOUNTER — Other Ambulatory Visit: Payer: Self-pay

## 2018-04-09 VITALS — BP 110/72 | HR 64 | Temp 98.4°F | Resp 16 | Ht 63.0 in | Wt 154.0 lb

## 2018-04-09 DIAGNOSIS — R319 Hematuria, unspecified: Secondary | ICD-10-CM

## 2018-04-09 DIAGNOSIS — N390 Urinary tract infection, site not specified: Secondary | ICD-10-CM

## 2018-04-09 DIAGNOSIS — Z01419 Encounter for gynecological examination (general) (routine) without abnormal findings: Secondary | ICD-10-CM

## 2018-04-09 LAB — POCT URINALYSIS DIPSTICK
Bilirubin, UA: NEGATIVE
Glucose, UA: NEGATIVE
Ketones, UA: NEGATIVE
Leukocytes, UA: NEGATIVE
NITRITE UA: NEGATIVE
PH UA: 5 (ref 5.0–8.0)
PROTEIN UA: NEGATIVE
UROBILINOGEN UA: NEGATIVE U/dL — AB

## 2018-04-09 MED ORDER — PHENAZOPYRIDINE HCL 100 MG PO TABS: 100.0000 mg | ORAL_TABLET | Freq: Three times a day (TID) | ORAL | 0 refills | Status: DC | PRN

## 2018-04-09 MED ORDER — NITROFURANTOIN MONOHYD MACRO 100 MG PO CAPS: ORAL_CAPSULE | ORAL | 0 refills | Status: DC

## 2018-04-09 NOTE — Patient Instructions (Signed)

## 2018-04-09 NOTE — Progress Notes (Signed)
47 y.o. Married [place] American female G0P0000 here with complaint of UTI, with onset  on ? 3 weeks ago.. Patient complaining of urinary frequency/urgency/ and slight pain with urination. Patient denies fever, chills, nausea.Slight back pain. No new personal products. Patient feels not related to sexual activity. Denies any vaginal symptoms.    Contraception is OCP. Patient trying to consume adequate water intake. " I remember this pain,when it occurred before and it was my bladder". Denies excessive soda use or vaginal dryness. No other  Health concerns today  Review of Systems  Constitutional: Negative for fever.       ? Chills, feels cold at times  HENT: Negative.   Respiratory: Negative.   Cardiovascular: Negative.   Gastrointestinal: Positive for abdominal pain. Negative for constipation.       Right lower quadrant pain, has not been emptying bowels as regular. Passing large amounts of gas.  Genitourinary: Positive for frequency, hematuria and urgency. Negative for dysuria.  Musculoskeletal: Positive for back pain.       Low back pain since urgency started.  Skin: Negative.   Neurological: Negative.   Psychiatric/Behavioral: Negative.      Physical Exam  Constitutional: She is oriented to person, place, and time. She appears well-developed and well-nourished.  Abdominal: Soft. Bowel sounds are normal. She exhibits no distension and no mass. There is tenderness in the suprapubic area. There is no rebound, no guarding and no CVA tenderness. No hernia.  Suprapubic only  Genitourinary: Vagina normal and uterus normal. Pelvic exam was performed with patient supine. There is no rash, tenderness, lesion or injury on the right labia. There is no rash, tenderness, lesion or injury on the left labia. Cervix exhibits no motion tenderness and no discharge. Right adnexum displays no mass, no tenderness and no fullness. Left adnexum displays no mass, no tenderness and no fullness.  Genitourinary  Comments:  Bladder,urethra and urethral meatus all tender. Slight redness noted at urethral meatus.  Lymphadenopathy: No inguinal adenopathy noted on the right or left side.       Right: No inguinal adenopathy present.       Left: No inguinal adenopathy present.  Neurological: She is alert and oriented to person, place, and time.  Skin: Skin is warm and dry.  Psychiatric: She has a normal mood and affect. Her behavior is normal. Judgment and thought content normal.    poct urine-rbc 1+ A: UTI Normal pelvic exam  P: Reviewed findings of UTI and need for treatment. Rx: Macrobid see order with instructions Rx Pyridium see order with instructions [facility]:LEXNT micro, culture Reviewed warning signs and symptoms of UTI and need to advise if occurring. Encouraged to limit soda, tea, and coffee and be sure to increase water intake. Questions addressed.   RV prn

## 2018-04-10 LAB — URINALYSIS, MICROSCOPIC ONLY: CASTS: NONE SEEN /LPF

## 2018-04-12 ENCOUNTER — Other Ambulatory Visit: Payer: Self-pay | Admitting: Obstetrics and Gynecology

## 2018-04-12 DIAGNOSIS — Z3041 Encounter for surveillance of contraceptive pills: Secondary | ICD-10-CM

## 2018-04-12 LAB — URINE CULTURE

## 2018-04-16 ENCOUNTER — Telehealth: Payer: Self-pay

## 2018-04-16 NOTE — Telephone Encounter (Signed)
Patient notified of results as written by provider 

## 2018-04-16 NOTE — Telephone Encounter (Signed)
See telephone encounter dated 04-16-18.

## 2018-04-16 NOTE — Telephone Encounter (Signed)
-----   Message from Verner Choleborah S Leonard, CNM sent at 04/13/2018  1:56 PM EDT ----- Notify patient her urine culture showed low colony count so medication should clear.  Patient status?

## 2018-04-16 NOTE — Telephone Encounter (Signed)
Left message to call back  

## 2018-04-16 NOTE — Telephone Encounter (Signed)
Patient returned call

## 2018-04-25 ENCOUNTER — Telehealth: Payer: Self-pay | Admitting: Certified Nurse Midwife

## 2018-04-25 NOTE — Telephone Encounter (Signed)
Reviewed with Leota Sauers, CNM, OV for further evaluation.  Call returned to patient. OV scheduled for 9/27 at 10:45am with Leota Sauers, CNM. ER precautions for worsening symptoms: pain, fever/chills, N/V.   Routing to provider for final review. Patient is agreeable to disposition. Will close encounter.

## 2018-04-25 NOTE — Telephone Encounter (Signed)
Spoke with patient. 9/10 macrobid bid x7 days for UTI. Patient reports symptoms did not completely resolve. Patient reports urgency, right sided flank pain and lower back pain. Has increased water intake and limited caffeine. Denies dysuria, fever/ N/V. "Can't tell if chills, I keep house cool". LMP 04/23/18. Denies vaginal d/c or odor.   Advised will review with Leota Sauers, CNM and return call.  Patient verbalizes understanding.  Leota Sauers, CNM- please advise.

## 2018-04-25 NOTE — Telephone Encounter (Signed)
Patient states pain in right side subsided a little bit, but is still there. Seen on 04/09/18.

## 2018-04-26 ENCOUNTER — Ambulatory Visit: Payer: BLUE CROSS/BLUE SHIELD | Admitting: Certified Nurse Midwife

## 2018-04-26 ENCOUNTER — Encounter: Payer: Self-pay | Admitting: Certified Nurse Midwife

## 2018-04-26 ENCOUNTER — Other Ambulatory Visit: Payer: Self-pay

## 2018-04-26 VITALS — BP 102/64 | HR 68 | Temp 98.7°F | Resp 16 | Ht 63.0 in | Wt 152.0 lb

## 2018-04-26 DIAGNOSIS — M25551 Pain in right hip: Secondary | ICD-10-CM | POA: Diagnosis not present

## 2018-04-26 DIAGNOSIS — R350 Frequency of micturition: Secondary | ICD-10-CM

## 2018-04-26 DIAGNOSIS — M791 Myalgia, unspecified site: Secondary | ICD-10-CM

## 2018-04-26 NOTE — Addendum Note (Signed)
Addended by: Verner Chol on: 04/26/2018 02:37 PM   Modules accepted: Orders

## 2018-04-26 NOTE — Patient Instructions (Signed)
Musculoskeletal Pain  Musculoskeletal pain is muscle and bone aches and pains. This pain can occur in any part of the body.  Follow these instructions at home:  · Only take medicines for pain, discomfort, or fever as told by your health care provider.  · You may continue all activities unless the activities cause more pain. When the pain lessens, slowly resume normal activities. Gradually increase the intensity and duration of the activities or exercise.  · During periods of severe pain, bed rest may be helpful. Lie or sit in any position that is comfortable, but get out of bed and walk around at least every several hours.  · If directed, put ice on the injured area.  ? Put ice in a plastic bag.  ? Place a towel between your skin and the bag.  ? Leave the ice on for 20 minutes, 2-3 times a day.  Contact a health care provider if:  · Your pain is getting worse.  · Your pain is not relieved with medicines.  · You lose function in the area of the pain if the pain is in your arms, legs, or neck.  This information is not intended to replace advice given to you by your health care provider. Make sure you discuss any questions you have with your health care provider.  Document Released: 07/17/2005 Document Revised: 12/28/2015 Document Reviewed: 03/21/2013  Elsevier Interactive Patient Education © 2017 [employer]

## 2018-04-26 NOTE — Progress Notes (Signed)
47 y.o. Married [place] American female G0P0000 here with complaint of still having pain in lower back and  Right hip and urinary frequency since  04/09/18.Marland Kitchen Patient complaining of urinary frequency but no other symptoms. Feels this is because she drinking more fluids.. Patient denies fever, chills, that she is aware of.. Back pain has occurred on right side radiates into hip. Denies fall or injury.  No change in vaginal discharge.. No new personal products. Patient denies any pain after sexual activity.  Contraception is OCP. Patient is drinking adequate water intake daily.   Patient was treated on 04/09/18 with Macrobid and negative urine culture. She feels this is different problem that previous occurrence. Denies upper abdominal pain or change in stools or nausea. No other issues.  Review of Systems  Constitutional: Negative.   HENT:       Headache  Eyes: Negative.   Respiratory: Negative.   Cardiovascular: Positive for chest pain.  Gastrointestinal: Negative.   Genitourinary:       Night urination  Musculoskeletal: Negative.   Skin: Negative.   Neurological: Negative.   Endo/Heme/Allergies:       Bruises  Psychiatric/Behavioral: Negative.     O: Healthy female WDWN Affect: Normal, orientation x 3 Skin : warm and dry CVAT: negative bilateral Abdomen: negative for suprapubic tenderness, soft, non tender, no masses. Points to right hip and low back area as point of pain.  Pelvic exam: External genital area: normal, no lesions Bladder,Urethra tender, Urethral meatus: tender, red Vagina: normal vaginal discharge, normal appearance, period present  Cervix: normal, non tender Uterus:normal,non tender Adnexa: normal non tender, no fullness or masses   A:Normal pelvic exam poct urine-unable to dip. Urine was red from blood(period) No indication of UTI Suspect muscloskeletal  pain  P: Reviewed findings of normal pelvic exam and no indication of discomfort from gyn area and feel she  needs to follow up with PCP regarding hip and back pain. No indication of UTI. Patient given warning signs of abdominal pain and need to seek ER. If occurs.  Lab: Urine culture Reviewed warning signs and symptoms of UTI and need to advise if occurring.   RV prn

## 2018-04-27 LAB — URINE CULTURE

## 2018-11-27 ENCOUNTER — Telehealth: Payer: Self-pay | Admitting: Certified Nurse Midwife

## 2018-11-27 NOTE — Telephone Encounter (Signed)
Patient believes she has a bladder infection. Symptoms began Sunday 11/24/18. Aware she may need to be seen before filling prescription, but would like to discuss using Azo with a nurse.

## 2018-11-27 NOTE — Telephone Encounter (Signed)
Spoke with patient. Patient states that she began having right sided pain on Sunday 11/24/2018. Denies any urinary symptoms, fevers, chills, or lower back pain. Reports pain is constant and is a 6/10. Reports she feels nauseated, feels sick to her stomach, and had 3 soft stools yesterday. Advised patient this could be GI related. Patient does have a PCP provider. Advised patient to seek evaluation with PCP to ensure she can be evaluated for all symptoms. Patient verbalizes understanding and will contact her PCP for scheduling.  Routing to provider and will close encounter.

## 2019-02-10 ENCOUNTER — Other Ambulatory Visit: Payer: Self-pay | Admitting: Certified Nurse Midwife

## 2019-02-10 DIAGNOSIS — Z1231 Encounter for screening mammogram for malignant neoplasm of breast: Secondary | ICD-10-CM

## 2019-02-18 ENCOUNTER — Telehealth: Payer: Self-pay | Admitting: Certified Nurse Midwife

## 2019-02-18 NOTE — Telephone Encounter (Signed)
Left message on voicemail to call and reschedule cancelled appointment. °

## 2019-02-18 NOTE — Telephone Encounter (Signed)
Message not needed. °

## 2019-03-05 ENCOUNTER — Ambulatory Visit: Payer: BLUE CROSS/BLUE SHIELD | Admitting: Certified Nurse Midwife

## 2019-03-07 ENCOUNTER — Ambulatory Visit (INDEPENDENT_AMBULATORY_CARE_PROVIDER_SITE_OTHER): Payer: BC Managed Care – PPO | Admitting: Certified Nurse Midwife

## 2019-03-07 ENCOUNTER — Encounter: Payer: Self-pay | Admitting: Certified Nurse Midwife

## 2019-03-07 ENCOUNTER — Other Ambulatory Visit: Payer: Self-pay

## 2019-03-07 VITALS — BP 114/72 | HR 64 | Temp 97.2°F | Resp 16 | Ht 62.75 in | Wt 163.0 lb

## 2019-03-07 DIAGNOSIS — Z3041 Encounter for surveillance of contraceptive pills: Secondary | ICD-10-CM | POA: Diagnosis not present

## 2019-03-07 DIAGNOSIS — Z01419 Encounter for gynecological examination (general) (routine) without abnormal findings: Secondary | ICD-10-CM | POA: Diagnosis not present

## 2019-03-07 MED ORDER — BALZIVA 0.4-35 MG-MCG PO TABS: 1.0000 | ORAL_TABLET | Freq: Every day | ORAL | 4 refills | Status: DC

## 2019-03-07 NOTE — Progress Notes (Signed)
48 y.o. G0P0000 Married  African American Fe here for annual exam.  Periods normal but changed since new generic from mail order. Contraception OCP no warning signs, just lighter periods. Taking cranberry tablets to prevent UTI and has not had one in the past year!Malvin Johns. Saw neurology for migraines all stable, changed medication and working well. Sees PCP as needed. No other health issues today.  Patient's last menstrual period was 02/26/2019 (exact date).          Sexually active: Yes.    The current method of family planning is OCP (estrogen/progesterone).    Exercising: Yes.    walking, bike, rowing machine Smoker:  no  Review of Systems  Constitutional: Negative.        Cycle changes, weight gain, hot flashes with birth control change  HENT: Negative.   Eyes: Negative.   Respiratory: Negative.   Cardiovascular: Negative.   Gastrointestinal: Negative.   Genitourinary: Negative.   Musculoskeletal: Negative.   Skin: Negative.   Neurological: Negative.   Endo/Heme/Allergies: Negative.   Psychiatric/Behavioral: Negative.     Health Maintenance: Pap:  02-27-17 neg History of Abnormal Pap: no MMG:  03-19-18 category b density birads 1: neg Self Breast exams: yes Colonoscopy:  2017 neg BMD:   none TDaP:  2018 Shingles: no Pneumonia: no Hep C and HIV: neg many yrs ago Labs: if needed.   reports that she has never smoked. She has never used smokeless tobacco. She reports that she does not drink alcohol or use drugs.  Past Medical History:  Diagnosis Date  . Anemia   . Environmental allergies   . Migraine without aura   . Multiple chemical sensitivity syndrome     Past Surgical History:  Procedure Laterality Date  . WISDOM TOOTH EXTRACTION      Current Outpatient Medications  Medication Sig Dispense Refill  . Cyanocobalamin (VITAMIN B 12 PO) Take by mouth daily.    . Ergocalciferol (VITAMIN D2) 400 units TABS Take by mouth.    . Misc Natural Products (DANDELION ROOT PO) Take  by mouth daily.    . montelukast (SINGULAIR) 10 MG tablet Take 10 mg by mouth. Takes 1/2 at hs    . norethindrone-ethinyl estradiol (BALZIVA) 0.4-35 MG-MCG tablet Take 1 tablet by mouth daily. 84 tablet 4  . topiramate (TOPAMAX) 50 MG tablet Take 50 mg by mouth 2 (two) times daily.    Marland Kitchen. VITAMIN E PO Take by mouth.    . zolmitriptan (ZOMIG) 5 MG tablet Take 5 mg by mouth as needed for migraine.     No current facility-administered medications for this visit.     Family History  Problem Relation Age of Onset  . Diabetes Maternal Grandmother   . Heart failure Maternal Grandmother   . Ovarian cancer Paternal Grandmother   . Cervical cancer Paternal Grandmother   . Hypertension Mother   . Thyroid disease Mother        hypo  . Heart attack Cousin     ROS:  Pertinent items are noted in HPI.  Otherwise, a comprehensive ROS was negative.  Exam:   Ht 5' 2.75" (1.594 m)   Wt 163 lb (73.9 kg)   LMP 02/26/2019 (Exact Date)   BMI 29.10 kg/m  Height: 5' 2.75" (159.4 cm) Ht Readings from Last 3 Encounters:  03/07/19 5' 2.75" (1.594 m)  04/26/18 5\' 3"  (1.6 m)  04/09/18 5\' 3"  (1.6 m)    General appearance: alert, cooperative and appears stated age Head: Normocephalic, without  obvious abnormality, atraumatic Neck: no adenopathy, supple, symmetrical, trachea midline and thyroid normal to inspection and palpation Lungs: clear to auscultation bilaterally Breasts: normal appearance, no masses or tenderness, No nipple retraction or dimpling, No nipple discharge or bleeding, No axillary or supraclavicular adenopathy Heart: regular rate and rhythm Abdomen: soft, non-tender; no masses,  no organomegaly Extremities: extremities normal, atraumatic, no cyanosis or edema Skin: Skin color, texture, turgor normal. No rashes or lesions Lymph nodes: Cervical, supraclavicular, and axillary nodes normal. No abnormal inguinal nodes palpated Neurologic: Grossly normal   Pelvic: External genitalia:  no  lesions              Urethra:  normal appearing urethra with no masses, tenderness or lesions              Bartholin's and Skene's: normal                 Vagina: normal appearing vagina with normal color and discharge, no lesions              Cervix: no cervical motion tenderness, no lesions and normal appearance              Pap taken: No. Bimanual Exam:  Uterus:  normal size, contour, position, consistency, mobility, non-tender              Adnexa: normal adnexa and no mass, fullness, tenderness               Rectovaginal: Confirms               Anus:  normal sphincter tone, no lesions  Chaperone present: yes  A:  Well Woman with normal exam  Contraception OCP desired  History of migraine well controlled, no aura with neurology follow up  Weight gain, but working on eating better diet  P:   Reviewed health and wellness pertinent to exam  Risks/benefits/warning signs reviewed, desires Rx  Rx Hulda Humphrey see order with instructions  Continue follow up with MD as indicated.  Pap smear: no  counseled on breast self exam, mammography screening, feminine hygiene, adequate intake of calcium and vitamin D, diet and exercise return annually or prn  An After Visit Summary was printed and given to the patient.

## 2019-03-25 ENCOUNTER — Ambulatory Visit
Admission: RE | Admit: 2019-03-25 | Discharge: 2019-03-25 | Disposition: A | Discharge status: Home / Self Care | Payer: BC Managed Care – PPO | Source: Ambulatory Visit | Attending: Certified Nurse Midwife | Admitting: Certified Nurse Midwife

## 2019-03-25 ENCOUNTER — Other Ambulatory Visit: Payer: Self-pay

## 2019-03-25 DIAGNOSIS — Z1231 Encounter for screening mammogram for malignant neoplasm of breast: Secondary | ICD-10-CM

## 2019-10-22 ENCOUNTER — Encounter: Payer: Self-pay | Admitting: Certified Nurse Midwife

## 2020-01-05 ENCOUNTER — Other Ambulatory Visit: Payer: Self-pay | Admitting: Obstetrics & Gynecology

## 2020-01-05 DIAGNOSIS — Z1231 Encounter for screening mammogram for malignant neoplasm of breast: Secondary | ICD-10-CM

## 2020-01-22 DIAGNOSIS — R3129 Other microscopic hematuria: Secondary | ICD-10-CM | POA: Insufficient documentation

## 2020-03-08 NOTE — Progress Notes (Signed)
49 y.o. G0P0000 Married [place] or [place] American female here for annual exam.  She had issue with kidney stones this year.  She is being followed by Dr. Orlinda Blalock at [facility].  H/o migraines.  Never had an aura.  Followed by Dr. Maple Hudson, Neurology at [facility].    She is on OCPs, Balziva 0.4/35.  D/w pt lowering her estrogen dosage in her OCP.    PCP:  Burnett Kanaris, PA-C.  Blood work done 07/2017 and 12/23/2019.  Patient's last menstrual period was 03/03/2020 (exact date).          Sexually active: Yes.    The current method of family planning is OCP (estrogen/progesterone).    Exercising: No.  exercise Smoker:  no  Health Maintenance: Pap:  02-27-17 neg History of abnormal Pap:  no MMG:  03-26-2019 category b density birads 1:neg, already scheduled for 03/26/2020 Colonoscopy:  11/2015 neg, done with Dr. Dierdre Searles at [facility] BMD:   none TDaP:  2018 Pneumonia vaccine(s):  no Shingrix:   no Hep C testing: neg many yrs ago Screening Labs: with Dr. Piedad Climes   reports that she has never smoked. She has never used smokeless tobacco. She reports that she does not drink alcohol and does not use drugs.  Past Medical History:  Diagnosis Date  . Anemia   . Environmental allergies   . Kidney stones   . Migraine without aura   . Multiple chemical sensitivity syndrome     Past Surgical History:  Procedure Laterality Date  . WISDOM TOOTH EXTRACTION      Current Outpatient Medications  Medication Sig Dispense Refill  . Cyanocobalamin (VITAMIN B 12 PO) Take by mouth daily.    . frovatriptan (FROVA) 2.5 MG tablet Take 2.5 mg by mouth as needed for migraine. If recurs, may repeat after 2 hours. Max of 3 tabs in 24 hours.    . montelukast (SINGULAIR) 10 MG tablet Take 10 mg by mouth. Takes 1/2 at hs    . norethindrone-ethinyl estradiol (BALZIVA) 0.4-35 MG-MCG tablet Take 1 tablet by mouth daily. 84 tablet 4  . topiramate (TOPAMAX) 50 MG tablet Take 50 mg by mouth 2 (two) times daily.     No current  facility-administered medications for this visit.    Family History  Problem Relation Age of Onset  . Diabetes Maternal Grandmother   . Heart failure Maternal Grandmother   . Ovarian cancer Paternal Grandmother   . Cervical cancer Paternal Grandmother   . Hypertension Mother   . Thyroid disease Mother        hypo  . Heart attack Cousin   . Breast cancer Neg Hx     Review of Systems  Constitutional: Negative.   HENT: Negative.   Eyes: Negative.   Respiratory: Negative.   Cardiovascular: Negative.   Gastrointestinal: Negative.   Endocrine: Negative.   Genitourinary: Negative.   Musculoskeletal: Negative.   Skin: Negative.   Allergic/Immunologic: Negative.   Neurological: Negative.   Hematological: Negative.   Psychiatric/Behavioral: Negative.     Exam:   BP 120/76   Pulse 68   Resp 16   Ht 5' 2.75" (1.594 m)   Wt 168 lb (76.2 kg)   LMP 03/03/2020 (Exact Date)   BMI 30.00 kg/m   Height: 5' 2.75" (159.4 cm)  General appearance: alert, cooperative and appears stated age Head: Normocephalic, without obvious abnormality, atraumatic Neck: no adenopathy, supple, symmetrical, trachea midline and thyroid normal to inspection and palpation Lungs: clear to auscultation bilaterally Breasts: normal  appearance, no masses or tenderness Heart: regular rate and rhythm Abdomen: soft, non-tender; bowel sounds normal; no masses,  no organomegaly Extremities: extremities normal, atraumatic, no cyanosis or edema Skin: Skin color, texture, turgor normal. No rashes or lesions Lymph nodes: Cervical, supraclavicular, and axillary nodes normal. No abnormal inguinal nodes palpated Neurologic: Grossly normal   Pelvic: External genitalia:  Left inner labia majora nevus measuring 3 x 67mm              Urethra:  normal appearing urethra with no masses, tenderness or lesions              Bartholins and Skenes: normal                 Vagina: normal appearing vagina with normal color and  discharge, no lesions              Cervix: no lesions              Pap taken: Yes.   Bimanual Exam:  Uterus:  normal size, contour, position, consistency, mobility, non-tender              Adnexa: normal adnexa and no mass, fullness, tenderness               Rectovaginal: Confirms               Anus:  normal sphincter tone, no lesions  Chaperone, Delton Coombes, CMA, was present for exam.  A:  Well Woman with normal exam H/o multiple chemical sensitivity syndrome, diagnosed by Dr. Tenny Craw, Allergy/Immunology Migraine without aura, followed by Dr. Maple Hudson at [facility] On OCPs for contraception Left inner labia majora nevus  P:   Mammogram guidelines reviewed.  Has appt scheduled. pap smear with HR HPV obtained today RF for OCPs Maryanna Shape) Lab work done with [facility] Colonoscopy due 2027 Vaccines reviewed.  She is planning on having the Covid vaccination done after her MMG. return annually or prn

## 2020-03-10 ENCOUNTER — Ambulatory Visit: Payer: BC Managed Care – PPO | Admitting: Certified Nurse Midwife

## 2020-03-15 ENCOUNTER — Other Ambulatory Visit: Payer: Self-pay

## 2020-03-15 ENCOUNTER — Ambulatory Visit: Payer: BC Managed Care – PPO | Admitting: Obstetrics & Gynecology

## 2020-03-15 ENCOUNTER — Other Ambulatory Visit (HOSPITAL_COMMUNITY)
Admission: RE | Admit: 2020-03-15 | Discharge: 2020-03-15 | Disposition: A | Discharge status: Home / Self Care | Payer: BC Managed Care – PPO | Source: Ambulatory Visit | Attending: Obstetrics & Gynecology | Admitting: Obstetrics & Gynecology

## 2020-03-15 ENCOUNTER — Encounter: Payer: Self-pay | Admitting: Obstetrics & Gynecology

## 2020-03-15 VITALS — BP 120/76 | HR 68 | Resp 16 | Ht 62.75 in | Wt 168.0 lb

## 2020-03-15 DIAGNOSIS — Z01419 Encounter for gynecological examination (general) (routine) without abnormal findings: Secondary | ICD-10-CM

## 2020-03-15 DIAGNOSIS — Z124 Encounter for screening for malignant neoplasm of cervix: Secondary | ICD-10-CM | POA: Insufficient documentation

## 2020-03-15 DIAGNOSIS — Z3041 Encounter for surveillance of contraceptive pills: Secondary | ICD-10-CM

## 2020-03-15 MED ORDER — BALZIVA 0.4-35 MG-MCG PO TABS: 1.0000 | ORAL_TABLET | Freq: Every day | ORAL | 3 refills | Status: DC

## 2020-03-15 NOTE — Patient Instructions (Signed)
Dr. Drue Novel and Esperanza Richters at [facility]  Abbe Amsterdam, MD, or Angela Adam PA at [facility]

## 2020-03-16 LAB — CYTOLOGY - PAP
Comment: NEGATIVE
Diagnosis: NEGATIVE
High risk HPV: NEGATIVE

## 2020-03-26 ENCOUNTER — Ambulatory Visit
Admission: RE | Admit: 2020-03-26 | Discharge: 2020-03-26 | Disposition: A | Discharge status: Home / Self Care | Payer: BC Managed Care – PPO | Source: Ambulatory Visit | Attending: Obstetrics & Gynecology | Admitting: Obstetrics & Gynecology

## 2020-03-26 ENCOUNTER — Other Ambulatory Visit: Payer: Self-pay

## 2020-03-26 DIAGNOSIS — Z1231 Encounter for screening mammogram for malignant neoplasm of breast: Secondary | ICD-10-CM

## 2020-03-27 ENCOUNTER — Ambulatory Visit: Payer: BC Managed Care – PPO | Attending: Internal Medicine

## 2020-03-27 DIAGNOSIS — Z23 Encounter for immunization: Secondary | ICD-10-CM

## 2020-03-27 NOTE — Progress Notes (Signed)
° °  Covid-19 Vaccination Clinic  Name:  Adrianna Dudas    MRN: [id number] DOB: 1971/04/04  03/27/2020  Ms. Selner was observed post Covid-19 immunization for 15 minutes without incident. She was provided with Vaccine Information Sheet and instruction to access the V-Safe system.   Ms. Casas was instructed to call 911 with any severe reactions post vaccine:  Difficulty breathing   Swelling of face and throat   A fast heartbeat   A bad rash all over body   Dizziness and weakness   Immunizations Administered    Name Date Dose VIS Date Route   Pfizer COVID-19 Vaccine 03/27/2020 11:35 AM 0.3 mL 09/24/2018 Intramuscular   Manufacturer: [employer], [employer]   Lot: [id number]   NDC: [address]-0

## 2020-03-30 ENCOUNTER — Other Ambulatory Visit: Payer: Self-pay | Admitting: Obstetrics & Gynecology

## 2020-03-30 DIAGNOSIS — R928 Other abnormal and inconclusive findings on diagnostic imaging of breast: Secondary | ICD-10-CM

## 2020-04-09 ENCOUNTER — Ambulatory Visit
Admission: RE | Admit: 2020-04-09 | Discharge: 2020-04-09 | Disposition: A | Discharge status: Home / Self Care | Payer: BC Managed Care – PPO | Source: Ambulatory Visit | Attending: Obstetrics & Gynecology | Admitting: Obstetrics & Gynecology

## 2020-04-09 ENCOUNTER — Other Ambulatory Visit: Payer: Self-pay | Admitting: Obstetrics & Gynecology

## 2020-04-09 ENCOUNTER — Other Ambulatory Visit: Payer: Self-pay

## 2020-04-09 DIAGNOSIS — N6001 Solitary cyst of right breast: Secondary | ICD-10-CM

## 2020-04-09 DIAGNOSIS — R928 Other abnormal and inconclusive findings on diagnostic imaging of breast: Secondary | ICD-10-CM

## 2020-04-19 ENCOUNTER — Ambulatory Visit: Payer: BC Managed Care – PPO | Attending: Internal Medicine

## 2020-04-19 DIAGNOSIS — Z23 Encounter for immunization: Secondary | ICD-10-CM

## 2020-04-19 NOTE — Progress Notes (Signed)
   Covid-19 Vaccination Clinic  Name:  Kaylee Simon    MRN: [id number] DOB: [birth year]  04/19/2020  Kaylee Simon was observed post Covid-19 immunization for 15 minutes without incident. She was provided with Vaccine Information Sheet and instruction to access the V-Safe system. Vaccinated by Fanny Bien.  Kaylee Simon was instructed to call 911 with any severe reactions post vaccine: Marland Kitchen Difficulty breathing  . Swelling of face and throat  . A fast heartbeat  . A bad rash all over body  . Dizziness and weakness   Immunizations Administered    Name Date Dose VIS Date Route   Pfizer COVID-19 Vaccine 04/19/2020 10:47 AM 0.3 mL 09/24/2018 Intramuscular   Manufacturer: [employer], [employer]   Lot: [id number]   NDC: [id number]

## 2020-04-23 MED FILL — PFIZER-BIONTECH COVID-19 VA: 30 | 1 days supply | Qty: 0 | Fill #0

## 2020-05-09 ENCOUNTER — Emergency Department (HOSPITAL_BASED_OUTPATIENT_CLINIC_OR_DEPARTMENT_OTHER): Payer: BC Managed Care – PPO

## 2020-05-09 ENCOUNTER — Emergency Department (HOSPITAL_BASED_OUTPATIENT_CLINIC_OR_DEPARTMENT_OTHER)
Admission: EM | Admit: 2020-05-09 | Discharge: 2020-05-09 | Disposition: A | Discharge status: Home / Self Care | Payer: BC Managed Care – PPO | Attending: Emergency Medicine | Admitting: Emergency Medicine

## 2020-05-09 ENCOUNTER — Other Ambulatory Visit: Payer: Self-pay

## 2020-05-09 ENCOUNTER — Encounter (HOSPITAL_BASED_OUTPATIENT_CLINIC_OR_DEPARTMENT_OTHER): Payer: Self-pay | Admitting: Emergency Medicine

## 2020-05-09 DIAGNOSIS — R072 Precordial pain: Secondary | ICD-10-CM | POA: Insufficient documentation

## 2020-05-09 DIAGNOSIS — Z91010 Allergy to peanuts: Secondary | ICD-10-CM | POA: Diagnosis not present

## 2020-05-09 LAB — BASIC METABOLIC PANEL
Anion gap: 9 (ref 5–15)
BUN: 11 mg/dL (ref 6–20)
CO2: 20 mmol/L — ABNORMAL LOW (ref 22–32)
Calcium: 8.6 mg/dL — ABNORMAL LOW (ref 8.9–10.3)
Chloride: 109 mmol/L (ref 98–111)
Creatinine, Ser: 0.76 mg/dL (ref 0.44–1.00)
GFR, Estimated: >60 mL/min (ref >60)
Glucose, Bld: 95 mg/dL (ref 70–99)
Potassium: 3.5 mmol/L (ref 3.5–5.1)
Sodium: 138 mmol/L (ref 135–145)

## 2020-05-09 LAB — CBC WITH DIFFERENTIAL/PLATELET
Abs Immature Granulocytes: 0.02 10*3/uL (ref 0.00–0.07)
Basophils Absolute: 0 10*3/uL (ref 0.0–0.1)
Basophils Relative: 0 %
Eosinophils Absolute: 0.2 10*3/uL (ref 0.0–0.5)
Eosinophils Relative: 2 %
HCT: 41.1 % (ref 36.0–46.0)
Hemoglobin: 13 g/dL (ref 12.0–15.0)
Immature Granulocytes: 0 %
Lymphocytes Relative: 37 %
Lymphs Abs: 2.7 10*3/uL (ref 0.7–4.0)
MCH: 27.8 pg (ref 26.0–34.0)
MCHC: 31.6 g/dL (ref 30.0–36.0)
MCV: 88 fL (ref 80.0–100.0)
Monocytes Absolute: 0.7 10*3/uL (ref 0.1–1.0)
Monocytes Relative: 9 %
Neutro Abs: 3.7 10*3/uL (ref 1.7–7.7)
Neutrophils Relative %: 52 %
Platelets: 325 10*3/uL (ref 150–400)
RBC: 4.67 MIL/uL (ref 3.87–5.11)
RDW: 13.3 % (ref 11.5–15.5)
WBC: 7.3 10*3/uL (ref 4.0–10.5)
nRBC: 0 % (ref 0.0–0.2)

## 2020-05-09 LAB — TROPONIN I (HIGH SENSITIVITY): Troponin I (High Sensitivity): <2 ng/L (ref <18)

## 2020-05-09 LAB — PREGNANCY, URINE: Preg Test, Ur: NEGATIVE

## 2020-05-09 MED ORDER — IOHEXOL 350 MG/ML SOLN
100.0000 mL | Freq: Once | INTRAVENOUS | Status: AC | PRN
  Administered 2020-05-09: 100 mL via INTRAVENOUS

## 2020-05-09 MED ORDER — IBUPROFEN 800 MG PO TABS: 800.0000 mg | ORAL_TABLET | Freq: Three times a day (TID) | ORAL | 0 refills | Status: DC

## 2020-05-09 MED ORDER — ALUM & MAG HYDROXIDE-SIMETH 200-200-20 MG/5ML PO SUSP
30.0000 mL | Freq: Once | ORAL | Status: AC
  Administered 2020-05-09: 30 mL via ORAL
  Filled 2020-05-09: qty 30

## 2020-05-09 MED ORDER — ACETAMINOPHEN 500 MG PO TABS
1000.0000 mg | ORAL_TABLET | Freq: Once | ORAL | Status: AC
  Administered 2020-05-09: 1000 mg via ORAL
  Filled 2020-05-09: qty 2

## 2020-05-09 MED ORDER — KETOROLAC TROMETHAMINE 30 MG/ML IJ SOLN
30.0000 mg | Freq: Once | INTRAMUSCULAR | Status: AC
  Administered 2020-05-09: 30 mg via INTRAVENOUS
  Filled 2020-05-09: qty 1

## 2020-05-09 NOTE — ED Provider Notes (Signed)
7:10 AM Care assumed from Dr. Nicanor Alcon. At time of transfer of care, plan is to follow-up on CT PE study. If PE study is reassuring, dissipate discharge home with plans to follow-up with a PCP.  8:39 AM CT scan just resulted.  No evidence of pulmonary embolism or other concerning cause of her discomfort.  Physiologic pleural effusion was seen which we discussed.  No evidence of pericardial effusion or other concerning findings.  Patient and family were informed of these findings and they agree with plan for discharge home with PCP follow-up.  She understand return precautions and was discharged in good condition.  Clinical Impression: 1. Precordial pain     Disposition: Discharge  Condition: Good  I have discussed the results, Dx and Tx plan with the pt(& family if present). He/she/they expressed understanding and agree(s) with the plan. Discharge instructions discussed at great length. Strict return precautions discussed and pt &/or family have verbalized understanding of the instructions. No further questions at time of discharge.    New Prescriptions   IBUPROFEN (ADVIL) 800 MG TABLET    Take 1 tablet (800 mg total) by mouth 3 (three) times daily.    Follow Up: Gillian Scarce [address] [place] [address] [phone]     [facility] [address] [id number] [address] [id number] [address] [phone]           Rylah Fukuda, Canary Brim, MD 05/09/20 [phone]

## 2020-05-09 NOTE — Discharge Instructions (Signed)
Your work-up today was overall reassuring.  Please follow-up with your primary doctor and use the medication to help with your discomfort.  Please rest and stay hydrated.  If any symptoms change or worsen, please return to the nearest emergency department.

## 2020-05-09 NOTE — ED Triage Notes (Signed)
Pt c/o anterior chest pain that started one day ago. Has not taken anything for pain. States pain is sharp and constant. Denies n/v or sob. Denies any long car rides.

## 2020-05-09 NOTE — ED Provider Notes (Addendum)
[facility] Provider Note   CSN: [id number] Arrival date & time: 05/09/20  0440     History No chief complaint on file.   Kaylee Simon is a 49 y.o. female.  The history is provided by the patient.  Chest Pain Pain location:  Substernal area Pain quality: sharp   Pain severity:  Moderate Onset quality:  Gradual Duration:  1 day Timing:  Constant Progression:  Unchanged Chronicity:  New Context: at rest   Relieved by:  Nothing Worsened by:  Nothing Ineffective treatments:  None tried Associated symptoms: no abdominal pain, no AICD problem, no altered mental status, no anorexia, no anxiety, no back pain, no claudication, no cough, no diaphoresis, no dizziness, no dysphagia, no fatigue, no fever, no headache, no heartburn, no lower extremity edema, no nausea, no near-syncope, no numbness, no orthopnea, no palpitations, no PND, no shortness of breath, no syncope, no vomiting and no weakness   Risk factors: birth control   Risk factors: no aortic disease, no hypertension, no immobilization, no prior DVT/PE and no surgery   Patient with h/o of migraine presents with sharp SSCP that is worse with inspiration and constant x 24 hours.  No leg pain nor swelling.  No travel.  Patient is on an OCP.  No exertional pain, no SOB, no DOE.  No n/v/d.  Patient denies cough or fevers.       Past Medical History:  Diagnosis Date  . History of anemia   . Kidney stones   . Migraine without aura   . Multiple chemical sensitivity syndrome     Patient Active Problem List   Diagnosis Date Noted  . Tension-type headache, not intractable 05/25/2016  . Neck pain 10/21/2015  . Migraine without aura and responsive to treatment 10/21/2015  . Migraine without aura and without status migrainosus, not intractable 10/21/2015  . Occipital headache 10/21/2015  . Sensitivity to medication 10/21/2015    Past Surgical History:  Procedure Laterality Date  . WISDOM TOOTH  EXTRACTION       OB History    Gravida  0   Para  0   Term  0   Preterm  0   AB  0   Living  0     SAB  0   TAB  0   Ectopic  0   Multiple  0   Live Births  0           Family History  Problem Relation Age of Onset  . Diabetes Maternal Grandmother   . Heart failure Maternal Grandmother   . Ovarian cancer Paternal Grandmother   . Cervical cancer Paternal Grandmother   . Hypertension Mother   . Thyroid disease Mother        hypo  . Heart attack Cousin   . Breast cancer Neg Hx     Social History   Tobacco Use  . Smoking status: Never Smoker  . Smokeless tobacco: Never Used  Vaping Use  . Vaping Use: Never used  Substance Use Topics  . Alcohol use: No    Alcohol/week: 0.0 standard drinks  . Drug use: No    Home Medications Prior to Admission medications   Medication Sig Start Date End Date Taking? Authorizing Provider  Cyanocobalamin (VITAMIN B 12 PO) Take by mouth daily.    [provider]  frovatriptan (FROVA) 2.5 MG tablet Take 2.5 mg by mouth as needed for migraine. If recurs, may repeat after 2 hours. Max of  3 tabs in 24 hours.    [provider]  montelukast (SINGULAIR) 10 MG tablet Take 10 mg by mouth. Takes 1/2 at hs    [provider]  norethindrone-ethinyl estradiol (BALZIVA) 0.4-35 MG-MCG tablet Take 1 tablet by mouth daily. 03/15/20   Jerene Bears, MD  topiramate (TOPAMAX) 50 MG tablet Take 50 mg by mouth 2 (two) times daily.    [provider]    Allergies    Peanuts [peanut oil] and Prunus persica  Review of Systems   Review of Systems  Constitutional: Negative for diaphoresis, fatigue and fever.  HENT: Negative for trouble swallowing.   Eyes: Negative for visual disturbance.  Respiratory: Negative for cough and shortness of breath.   Cardiovascular: Positive for chest pain. Negative for palpitations, orthopnea, claudication, leg swelling, syncope, PND and near-syncope.  Gastrointestinal:  Negative for abdominal pain, anorexia, heartburn, nausea and vomiting.  Genitourinary: Negative for difficulty urinating.  Musculoskeletal: Negative for back pain.  Skin: Negative for rash.  Neurological: Negative for dizziness, weakness, numbness and headaches.  Psychiatric/Behavioral: Negative for agitation.  All other systems reviewed and are negative.   Physical Exam Updated Vital Signs There were no vitals taken for this visit.  Physical Exam Vitals and nursing note reviewed.  Constitutional:      General: She is not in acute distress.    Appearance: Normal appearance.  HENT:     Head: Normocephalic and atraumatic.     Nose: Nose normal.  Eyes:     Conjunctiva/sclera: Conjunctivae normal.     Pupils: Pupils are equal, round, and reactive to light.  Cardiovascular:     Rate and Rhythm: Normal rate and regular rhythm.     Pulses: Normal pulses.     Heart sounds: Normal heart sounds.  Pulmonary:     Effort: Pulmonary effort is normal.     Breath sounds: Normal breath sounds.  Abdominal:     General: Abdomen is flat. Bowel sounds are normal.     Palpations: Abdomen is soft.     Tenderness: There is no abdominal tenderness. There is no guarding or rebound.  Musculoskeletal:        General: Normal range of motion.     Cervical back: Normal range of motion and neck supple.  Skin:    General: Skin is warm and dry.     Capillary Refill: Capillary refill takes less than 2 seconds.  Neurological:     General: No focal deficit present.     Mental Status: She is alert and oriented to person, place, and time.     Deep Tendon Reflexes: Reflexes normal.  Psychiatric:        Mood and Affect: Mood normal.        Behavior: Behavior normal.     ED Results / Procedures / Treatments   Labs (all labs ordered are listed, but only abnormal results are displayed) Results for orders placed or performed during the hospital encounter of 05/09/20  CBC with Differential/Platelet  Result  Value Ref Range   WBC 7.3 4.0 - 10.5 K/uL   RBC 4.67 3.87 - 5.11 MIL/uL   Hemoglobin 13.0 12.0 - 15.0 g/dL   HCT 53.6 36 - 46 %   MCV 88.0 80.0 - 100.0 fL   MCH 27.8 26.0 - 34.0 pg   MCHC 31.6 30.0 - 36.0 g/dL   RDW 14.4 31.5 - 40.0 %   Platelets 325 150 - 400 K/uL   nRBC 0.0 0.0 - 0.2 %  Neutrophils Relative % 52 %   Neutro Abs 3.7 1.7 - 7.7 K/uL   Lymphocytes Relative 37 %   Lymphs Abs 2.7 0.7 - 4.0 K/uL   Monocytes Relative 9 %   Monocytes Absolute 0.7 0.1 - 1.0 K/uL   Eosinophils Relative 2 %   Eosinophils Absolute 0.2 0 - 0 K/uL   Basophils Relative 0 %   Basophils Absolute 0.0 0 - 0 K/uL   Immature Granulocytes 0 %   Abs Immature Granulocytes 0.02 0.00 - 0.07 K/uL  Basic metabolic panel  Result Value Ref Range   Sodium 138 135 - 145 mmol/L   Potassium 3.5 3.5 - 5.1 mmol/L   Chloride 109 98 - 111 mmol/L   CO2 20 (L) 22 - 32 mmol/L   Glucose, Bld 95 70 - 99 mg/dL   BUN 11 6 - 20 mg/dL   Creatinine, Ser 4.090.76 0.44 - 1.00 mg/dL   Calcium 8.6 (L) 8.9 - 10.3 mg/dL   GFR, Estimated >81>60 >19>60 mL/min   Anion gap 9 5 - 15  Pregnancy, urine  Result Value Ref Range   Preg Test, Ur NEGATIVE NEGATIVE  Troponin I (High Sensitivity)  Result Value Ref Range   Troponin I (High Sensitivity) <2 <18 ng/L   US BREAST LTD UNI RIGHT INC AXILLA  Result Date: 04/09/2020 CLINICAL DATA:  Patient was recalled from screening mammogram for a possible right breast mass. EXAM: DIGITAL DIAGNOSTIC RIGHT MAMMOGRAM WITH CAD AND TOMO ULTRASOUND RIGHT BREAST COMPARISON:  Previous exam(s). ACR Breast Density Category b: There are scattered areas of fibroglandular density. FINDINGS: Additional imaging of the right breast was performed. There is persistence of a 6 mm mass in the lateral aspect of the breast. There are no malignant type microcalcifications. Mammographic images were processed with CAD. Targeted ultrasound is performed, showing a well-circumscribed hypoechoic mass with increased through  transmission in the right breast at 9 o'clock 4 cm from the nipple measuring 7 x 3 x 6 mm. It is likely a benign cyst. IMPRESSION: Probable benign cyst in the right breast. RECOMMENDATION: Short-term interval follow-up right breast ultrasound 6 months is recommended. I have discussed the findings and recommendations with the patient. If applicable, a reminder letter will be sent to the patient regarding the next appointment. BI-RADS CATEGORY  3: Probably benign. Electronically Signed   By: Baird Lyonsina  Arceo M.D.   On: 04/09/2020 11:08   MM DIAG BREAST TOMO UNI RIGHT  Result Date: 04/09/2020 CLINICAL DATA:  Patient was recalled from screening mammogram for a possible right breast mass. EXAM: DIGITAL DIAGNOSTIC RIGHT MAMMOGRAM WITH CAD AND TOMO ULTRASOUND RIGHT BREAST COMPARISON:  Previous exam(s). ACR Breast Density Category b: There are scattered areas of fibroglandular density. FINDINGS: Additional imaging of the right breast was performed. There is persistence of a 6 mm mass in the lateral aspect of the breast. There are no malignant type microcalcifications. Mammographic images were processed with CAD. Targeted ultrasound is performed, showing a well-circumscribed hypoechoic mass with increased through transmission in the right breast at 9 o'clock 4 cm from the nipple measuring 7 x 3 x 6 mm. It is likely a benign cyst. IMPRESSION: Probable benign cyst in the right breast. RECOMMENDATION: Short-term interval follow-up right breast ultrasound 6 months is recommended. I have discussed the findings and recommendations with the patient. If applicable, a reminder letter will be sent to the patient regarding the next appointment. BI-RADS CATEGORY  3: Probably benign. Electronically Signed   By: Cathe Monsina  Arceo M.D.  On: 04/09/2020 11:08    EKG  Date: 05/09/2020  Rate:68  Rhythm: normal sinus rhythm  QRS Axis: normal  Intervals: normal  ST/T Wave abnormalities: normal  Conduction Disutrbances: none  Narrative  Interpretation: unremarkable     Radiology No results found.  Procedures Procedures (including critical care time)  Medications Ordered in ED Medications  acetaminophen (TYLENOL) tablet 1,000 mg (has no administration in time range)    ED Course  I have reviewed the triage vital signs and the nursing notes.  Pertinent labs & imaging results that were available during my care of the patient were reviewed by me and considered in my medical decision making (see chart for details).    Based on the time course one troponin with a normal EKG is sufficient to exclude MI.  HEART score is 1 very low risk for MACE.    Kaylee Simon was evaluated in Emergency Department on 05/09/2020 for the symptoms described in the history of present illness. She was evaluated in the context of the global COVID-19 pandemic, which necessitated consideration that the patient might be at risk for infection with the SARS-CoV-2 virus that causes COVID-19. Institutional protocols and algorithms that pertain to the evaluation of patients at risk for COVID-19 are in a state of rapid change based on information released by regulatory bodies including the CDC and federal and state organizations. These policies and algorithms were followed during the patient's care in the ED.  Final Clinical Impression(s) / ED Diagnoses  Signed out to Dr. Rush Landmark pending CTA results.  Patient is updated on the plan and is resting comfortably at this time.     Kaylee Hillary, MD 05/09/20 [id number]    Cy Blamer, MD 05/09/20 [id number]

## 2020-07-10 IMAGING — MG DIGITAL SCREENING BILATERAL MAMMOGRAM WITH CAD
2 series · 2 of 2 positions shown · non-contrast
Comparison: Previous exam(s).

CLINICAL DATA: Screening.

EXAM:
DIGITAL SCREENING BILATERAL MAMMOGRAM WITH CAD

[L MLO]
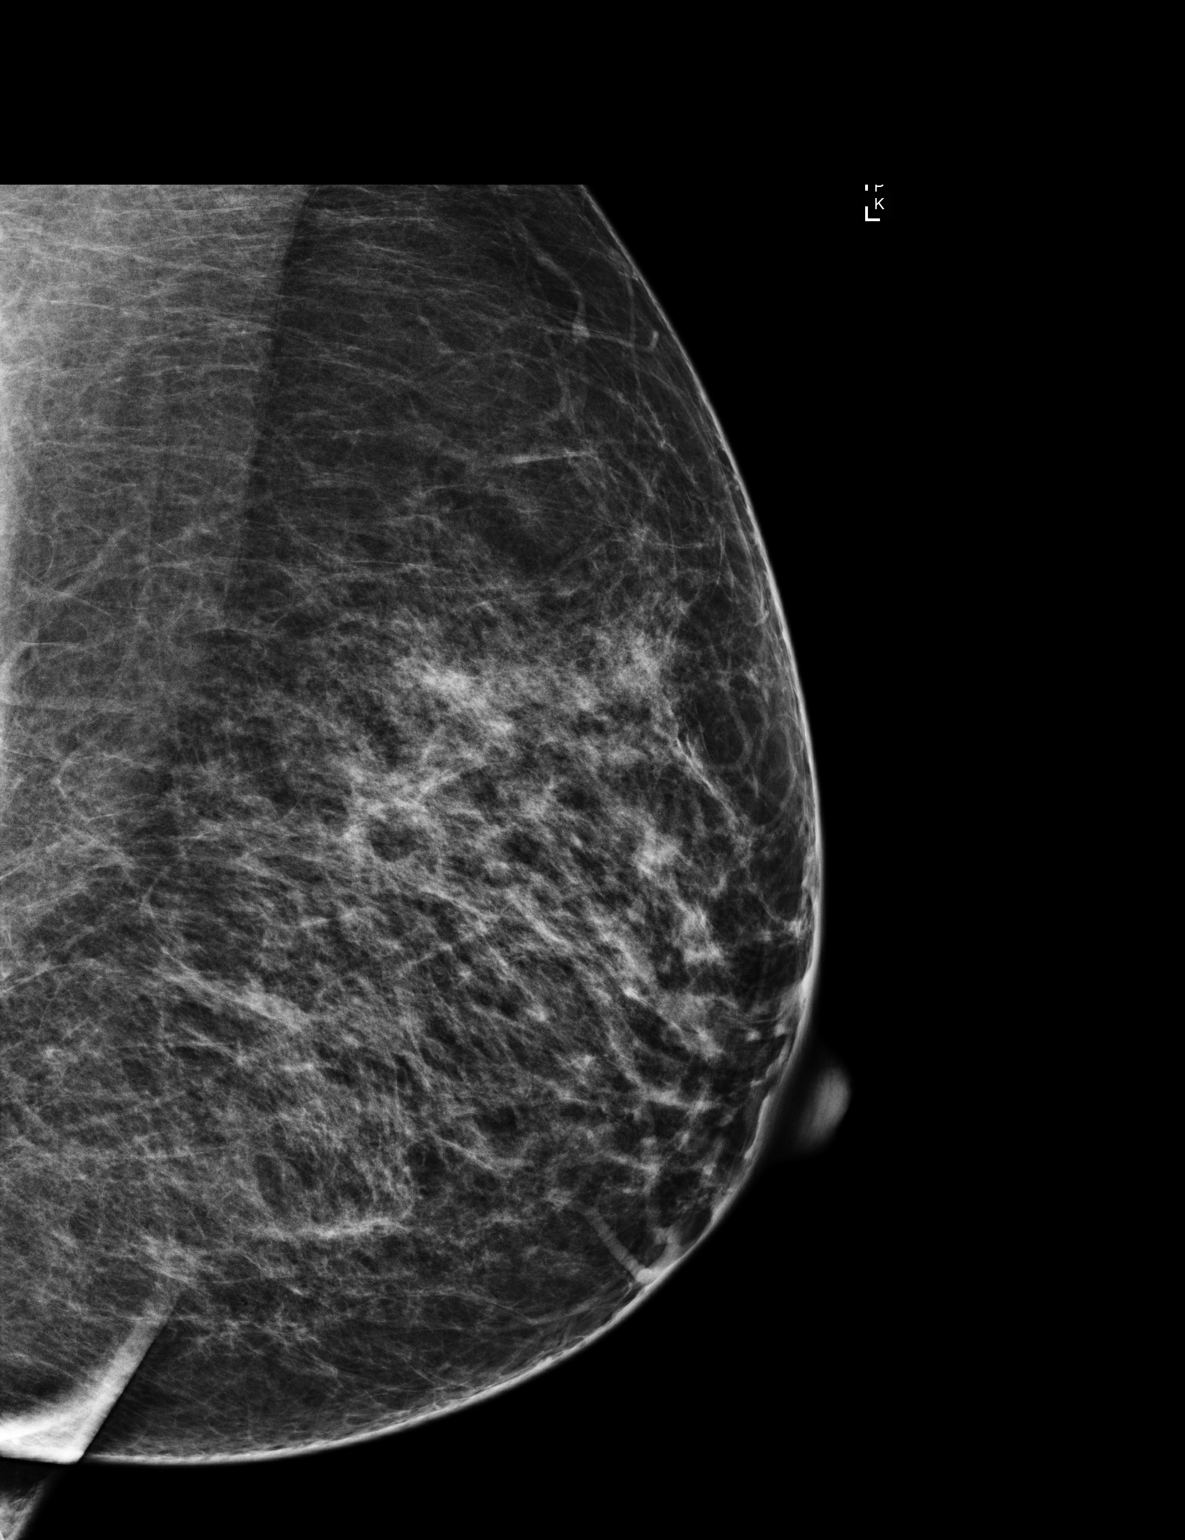

[R MLO]
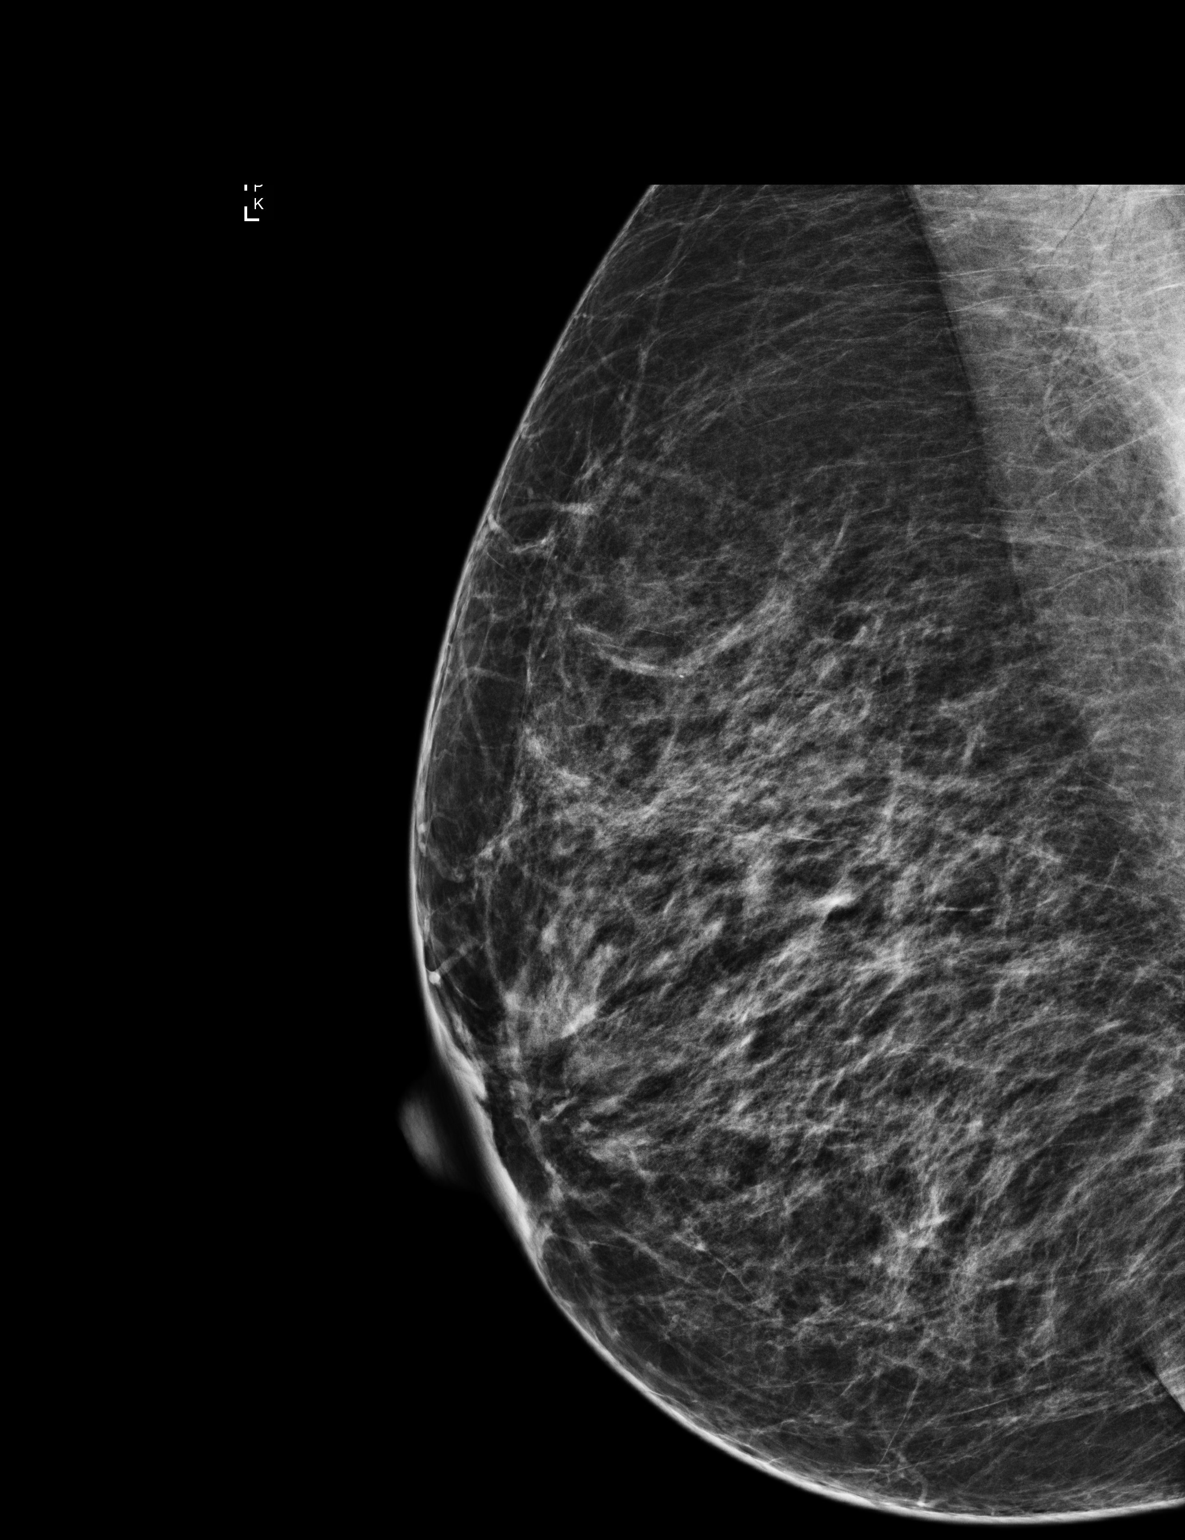

[2 of 2 positions shown; findings below may reference images not displayed]

ACR Breast Density Category b: There are scattered areas of
fibroglandular density.
FINDINGS: There are no findings suspicious for malignancy. Images were
processed with CAD.
IMPRESSION: No mammographic evidence of malignancy. A result letter of this
screening mammogram will be mailed directly to the patient.

RECOMMENDATION:
Screening mammogram in one year. (Code:AS-G-LCT)

BI-RADS CATEGORY  1: Negative.

## 2020-10-08 ENCOUNTER — Ambulatory Visit
Admission: RE | Admit: 2020-10-08 | Discharge: 2020-10-08 | Disposition: A | Discharge status: Home / Self Care | Payer: BC Managed Care – PPO | Source: Ambulatory Visit | Attending: Obstetrics & Gynecology | Admitting: Obstetrics & Gynecology

## 2020-10-08 ENCOUNTER — Other Ambulatory Visit: Payer: Self-pay

## 2020-10-08 ENCOUNTER — Other Ambulatory Visit: Payer: Self-pay | Admitting: Obstetrics & Gynecology

## 2020-10-08 DIAGNOSIS — N6001 Solitary cyst of right breast: Secondary | ICD-10-CM

## 2020-10-21 ENCOUNTER — Ambulatory Visit: Payer: BC Managed Care – PPO | Attending: Internal Medicine

## 2020-10-21 DIAGNOSIS — Z23 Encounter for immunization: Secondary | ICD-10-CM

## 2020-10-21 NOTE — Progress Notes (Signed)
   Covid-19 Vaccination Clinic  Name:  Kaylee Simon    MRN: [id number] DOB: 1971-06-08  10/21/2020  [name] was observed post Covid-19 immunization for 15 minutes without incident. She was provided with Vaccine Information Sheet and instruction to access the V-Safe system.   [name] was instructed to call 911 with any severe reactions post vaccine: Marland Kitchen Difficulty breathing  . Swelling of face and throat  . A fast heartbeat  . A bad rash all over body  . Dizziness and weakness   Immunizations Administered    Name Date Dose VIS Date Route   PFIZER Comrnaty(Gray TOP) Covid-19 Vaccine 10/21/2020 12:23 PM 0.3 mL 07/08/2020 Intramuscular   Manufacturer: [employer], [employer]   Lot: [id number]   NDC: [phone]

## 2021-02-10 ENCOUNTER — Other Ambulatory Visit: Payer: Self-pay | Admitting: Obstetrics & Gynecology

## 2021-02-10 DIAGNOSIS — Z3041 Encounter for surveillance of contraceptive pills: Secondary | ICD-10-CM

## 2021-03-17 ENCOUNTER — Other Ambulatory Visit: Payer: Self-pay

## 2021-03-17 ENCOUNTER — Ambulatory Visit (INDEPENDENT_AMBULATORY_CARE_PROVIDER_SITE_OTHER): Payer: BC Managed Care – PPO | Admitting: Obstetrics & Gynecology

## 2021-03-17 ENCOUNTER — Encounter (HOSPITAL_BASED_OUTPATIENT_CLINIC_OR_DEPARTMENT_OTHER): Payer: Self-pay | Admitting: Obstetrics & Gynecology

## 2021-03-17 VITALS — BP 129/83 | HR 75 | Ht 63.0 in | Wt 180.6 lb

## 2021-03-17 DIAGNOSIS — Z01419 Encounter for gynecological examination (general) (routine) without abnormal findings: Secondary | ICD-10-CM | POA: Diagnosis not present

## 2021-03-17 DIAGNOSIS — Z3041 Encounter for surveillance of contraceptive pills: Secondary | ICD-10-CM

## 2021-03-17 DIAGNOSIS — R232 Flushing: Secondary | ICD-10-CM | POA: Diagnosis not present

## 2021-03-17 DIAGNOSIS — G43009 Migraine without aura, not intractable, without status migrainosus: Secondary | ICD-10-CM | POA: Diagnosis not present

## 2021-03-17 MED ORDER — NORETHIN ACE-ETH ESTRAD-FE 1-20 MG-MCG PO TABS: 1.0000 | ORAL_TABLET | Freq: Every day | ORAL | 3 refills | Status: DC

## 2021-03-17 NOTE — Progress Notes (Signed)
50 y.o. G0P0000 Married [place] or [place] American female here for annual exam.    Mother just passed from gastric cancer.  Pt did have endoscopy and colonoscopy in 2017.  Advised pt to reach out to GI to ensure no additional testing for pt.    Cycles are lighter.  She's had some hot flashes this year.  Questions menopausal status.    Headaches have been worse the past few months.  Denies aura.  Pt feels this is due to stressors.  Lowering estrogen in OCPs discussed.  Pt comfortable with this.  Patient's last menstrual period was 02/23/2021.          Sexually active: Yes.    The current method of family planning is OCP (estrogen/progesterone).    Exercising: No.   Smoker:  no  Health Maintenance: Pap:  neg with neg HR HPV 2021 History of abnormal Pap:  no MMG:  03/2020, 03/2021 Colonoscopy:  11/2015, Dr. Conley Rolls at [facility] BMD:   not indicated TDaP:  01/2017 Hep C testing: discussed with pt Screening Labs: done with PCP   reports that she has never smoked. She has never used smokeless tobacco. She reports that she does not drink alcohol and does not use drugs.  Past Medical History:  Diagnosis Date   History of anemia    Kidney stones    Migraine without aura    Multiple chemical sensitivity syndrome     Past Surgical History:  Procedure Laterality Date   WISDOM TOOTH EXTRACTION      Current Outpatient Medications  Medication Sig Dispense Refill   BALZIVA 0.4-35 MG-MCG tablet TAKE 1 TABLET BY MOUTH EVERY DAY 84 tablet 0   cholecalciferol (VITAMIN D3) 25 MCG (1000 UNIT) tablet Take 1,000 Units by mouth daily.     Cyanocobalamin (VITAMIN B 12 PO) Take by mouth daily.     frovatriptan (FROVA) 2.5 MG tablet Take 2.5 mg by mouth as needed for migraine. If recurs, may repeat after 2 hours. Max of 3 tabs in 24 hours.     ibuprofen (ADVIL) 800 MG tablet Take 1 tablet (800 mg total) by mouth 3 (three) times daily. 21 tablet 0   montelukast (SINGULAIR) 10 MG tablet Take 10 mg by mouth. Takes  1/2 at hs     topiramate (TOPAMAX) 50 MG tablet Take 50 mg by mouth 2 (two) times daily.     vitamin E 1000 UNIT capsule Take 1,000 Units by mouth daily.     No current facility-administered medications for this visit.    Family History  Problem Relation Age of Onset   Cancer Mother        gastric cancer   Hypertension Mother    Thyroid disease Mother        hypo   Diabetes Maternal Grandmother    Heart failure Maternal Grandmother    Ovarian cancer Paternal Grandmother    Cervical cancer Paternal Grandmother    Heart attack Cousin    Breast cancer Neg Hx     Review of Systems  All other systems reviewed and are negative.  Exam:   BP 129/83 (BP Location: Left Arm, Patient Position: Sitting, Cuff Size: Large)   Pulse 75   Ht 5\' 3"  (1.6 m)   Wt 180 lb 9.6 oz (81.9 kg)   LMP 02/23/2021   BMI 31.99 kg/m   Height: 5\' 3"  (160 cm)  General appearance: alert, cooperative and appears stated age Head: Normocephalic, without obvious abnormality, atraumatic Neck: no adenopathy, supple,  symmetrical, trachea midline and thyroid normal to inspection and palpation Lungs: clear to auscultation bilaterally Breasts: normal appearance, no masses or tenderness Heart: regular rate and rhythm Abdomen: soft, non-tender; bowel sounds normal; no masses,  no organomegaly Extremities: extremities normal, atraumatic, no cyanosis or edema Skin: Skin color, texture, turgor normal. No rashes or lesions Lymph nodes: Cervical, supraclavicular, and axillary nodes normal. No abnormal inguinal nodes palpated Neurologic: Grossly normal   Pelvic: External genitalia:  no lesions              Urethra:  normal appearing urethra with no masses, tenderness or lesions              Bartholins and Skenes: normal                 Vagina: normal appearing vagina with normal color and no discharge, no lesions              Cervix: no lesions              Pap taken: No. Bimanual Exam:  Uterus:  normal size,  contour, position, consistency, mobility, non-tender              Adnexa: normal adnexa and no mass, fullness, tenderness               Rectovaginal: Confirms               Anus:  normal sphincter tone, no lesions  Chaperone, Ina Homes, CMA, was present for exam.  Assessment/Plan: 1. Well woman exam with routine gynecological exam - pap neg with neg HR HPV 2021.  Not indicated today. - MMG 03/2020 and scheduled for September - Colonoscopy 11/2015 with Dr. Conley Rolls at [facility].  Cannot find follow up but pt states was 10 years - Vaccines reviewed/updated - lab work done with PCP  2. Hot flashes - pt will return for [facility] - Follicle stimulating hormone; Future  3. Encounter for surveillance of contraceptive pills - will lower estrogen dosage in OCPs today.  Advised to call with any change in side effects or if cycles become heaviers. - OCP risks reviewed again today - norethindrone-ethinyl estradiol-FE (LOESTRIN FE) 1-20 MG-MCG tablet; Take 1 tablet by mouth daily.  Dispense: 84 tablet; Refill: 3  4. Migraine without aura and without status migrainosus, not intractable - followed by Dr. Kendall Flack as Pollyann Savoy

## 2021-03-22 ENCOUNTER — Other Ambulatory Visit: Payer: Self-pay

## 2021-03-22 ENCOUNTER — Other Ambulatory Visit (HOSPITAL_BASED_OUTPATIENT_CLINIC_OR_DEPARTMENT_OTHER)
Admission: RE | Admit: 2021-03-22 | Discharge: 2021-03-22 | Disposition: A | Discharge status: Home / Self Care | Payer: BC Managed Care – PPO | Source: Ambulatory Visit | Attending: Obstetrics & Gynecology | Admitting: Obstetrics & Gynecology

## 2021-03-22 ENCOUNTER — Other Ambulatory Visit (HOSPITAL_BASED_OUTPATIENT_CLINIC_OR_DEPARTMENT_OTHER): Payer: Self-pay

## 2021-03-22 DIAGNOSIS — R232 Flushing: Secondary | ICD-10-CM | POA: Diagnosis present

## 2021-03-23 LAB — FOLLICLE STIMULATING HORMONE: FSH: 1.2 m[IU]/mL

## 2021-04-12 ENCOUNTER — Ambulatory Visit
Admission: RE | Admit: 2021-04-12 | Discharge: 2021-04-12 | Disposition: A | Discharge status: Home / Self Care | Payer: BC Managed Care – PPO | Source: Ambulatory Visit | Attending: Obstetrics & Gynecology | Admitting: Obstetrics & Gynecology

## 2021-04-12 ENCOUNTER — Other Ambulatory Visit: Payer: Self-pay

## 2021-04-12 DIAGNOSIS — N6001 Solitary cyst of right breast: Secondary | ICD-10-CM

## 2021-04-28 ENCOUNTER — Ambulatory Visit: Payer: BC Managed Care – PPO | Attending: Internal Medicine

## 2021-04-28 DIAGNOSIS — Z23 Encounter for immunization: Secondary | ICD-10-CM

## 2021-04-29 NOTE — Progress Notes (Signed)
   Covid-19 Vaccination Clinic  Name:  Kaylee Simon    MRN: [id number] DOB: November 11, 1970  04/29/2021  [name] was observed post Covid-19 immunization for 15 minutes without incident. She was provided with Vaccine Information Sheet and instruction to access the V-Safe system.   [name] was instructed to call 911 with any severe reactions post vaccine: Difficulty breathing  Swelling of face and throat  A fast heartbeat  A bad rash all over body  Dizziness and weakness

## 2021-05-03 ENCOUNTER — Other Ambulatory Visit: Payer: Self-pay | Admitting: Obstetrics & Gynecology

## 2021-05-03 DIAGNOSIS — Z3041 Encounter for surveillance of contraceptive pills: Secondary | ICD-10-CM

## 2021-05-09 ENCOUNTER — Other Ambulatory Visit (HOSPITAL_BASED_OUTPATIENT_CLINIC_OR_DEPARTMENT_OTHER): Payer: Self-pay

## 2021-05-09 MED ORDER — COVID-19MRNA BIVAL VACC PFIZER 30 MCG/0.3ML IM SUSP
INTRAMUSCULAR | 0 refills | Status: DC
  Filled 2021-05-09: qty 0.3, 1d supply, fill #0

## 2022-01-16 DIAGNOSIS — N200 Calculus of kidney: Secondary | ICD-10-CM | POA: Insufficient documentation

## 2022-02-27 ENCOUNTER — Other Ambulatory Visit: Payer: Self-pay | Admitting: Obstetrics & Gynecology

## 2022-02-27 DIAGNOSIS — Z1231 Encounter for screening mammogram for malignant neoplasm of breast: Secondary | ICD-10-CM

## 2022-03-18 ENCOUNTER — Encounter (HOSPITAL_BASED_OUTPATIENT_CLINIC_OR_DEPARTMENT_OTHER): Payer: Self-pay | Admitting: Emergency Medicine

## 2022-03-18 ENCOUNTER — Emergency Department (HOSPITAL_BASED_OUTPATIENT_CLINIC_OR_DEPARTMENT_OTHER)
Admission: EM | Admit: 2022-03-18 | Discharge: 2022-03-18 | Disposition: A | Discharge status: Home / Self Care | Payer: BC Managed Care – PPO | Attending: Emergency Medicine | Admitting: Emergency Medicine

## 2022-03-18 DIAGNOSIS — M5442 Lumbago with sciatica, left side: Secondary | ICD-10-CM | POA: Diagnosis not present

## 2022-03-18 DIAGNOSIS — Z91010 Allergy to peanuts: Secondary | ICD-10-CM | POA: Diagnosis not present

## 2022-03-18 DIAGNOSIS — M545 Low back pain, unspecified: Secondary | ICD-10-CM | POA: Diagnosis present

## 2022-03-18 MED ORDER — LIDOCAINE 5 % EX PTCH
1.0000 | MEDICATED_PATCH | CUTANEOUS | Status: DC
  Administered 2022-03-18: 1 via TRANSDERMAL
  Filled 2022-03-18: qty 1

## 2022-03-18 MED ORDER — METHYLPREDNISOLONE 4 MG PO TBPK
ORAL_TABLET | ORAL | 0 refills | Status: DC
Start: 2022-03-18 — End: 2022-07-20

## 2022-03-18 MED ORDER — KETOROLAC TROMETHAMINE 60 MG/2ML IM SOLN
60.0000 mg | Freq: Once | INTRAMUSCULAR | Status: AC
  Administered 2022-03-18: 60 mg via INTRAMUSCULAR
  Filled 2022-03-18: qty 2

## 2022-03-18 MED ORDER — HYDROMORPHONE HCL 1 MG/ML IJ SOLN
0.5000 mg | Freq: Once | INTRAMUSCULAR | Status: AC
  Administered 2022-03-18: 0.5 mg via INTRAMUSCULAR
  Filled 2022-03-18: qty 1

## 2022-03-18 MED ORDER — OXYCODONE HCL 5 MG PO TABS: 5.0000 mg | ORAL_TABLET | ORAL | 0 refills | Status: DC | PRN

## 2022-03-18 NOTE — ED Notes (Signed)
ED Provider at bedside. 

## 2022-03-18 NOTE — ED Provider Notes (Signed)
[facility] EMERGENCY DEPARTMENT Provider Note   CSN: [id number] Arrival date & time: 03/18/22  7253     History {Add pertinent medical, surgical, social history, OB history to HPI:1} Chief Complaint  Patient presents with   Back Pain    Kaylee Simon is a 51 y.o. female.  HPI     Leaning over to get water bottle out of refrigerator and had sudden development of lower back pain with radiation down the left leg starting on Sunday 8/13 Saw chiropractor Tues, Wednesday, and Friday with no improvement Saw PCP who prescribed gabapentin, robaxin and gave kenalog injection Was taking tylenol, not ibuprofen.  Not having any improvement with medications.   No numbness or weakness, no loss of control of bowel or bladder. Was constipated but did have a BM. No dysuria. Does think having frequency, no difficulty urinating.  No fevers, falls, trauma, hx of IVDU, cancer hx.  Worse with movement  Hx of back pain 2019, saw chiropractor, improved until now  Past Medical History:  Diagnosis Date   History of anemia    Kidney stones    Migraine without aura    Multiple chemical sensitivity syndrome         Home Medications Prior to Admission medications   Medication Sig Start Date End Date Taking? Authorizing Provider  cholecalciferol (VITAMIN D3) 25 MCG (1000 UNIT) tablet Take 1,000 Units by mouth daily.    [provider]  COVID-19 mRNA bivalent vaccine, Pfizer, injection Inject into the muscle. 04/28/21   Judyann Munson, MD  Cyanocobalamin (VITAMIN B 12 PO) Take by mouth daily.    [provider]  frovatriptan (FROVA) 2.5 MG tablet Take 2.5 mg by mouth as needed for migraine. If recurs, may repeat after 2 hours. Max of 3 tabs in 24 hours.    [provider]  ibuprofen (ADVIL) 800 MG tablet Take 1 tablet (800 mg total) by mouth 3 (three) times daily. 05/09/20   Palumbo, April, MD  montelukast (SINGULAIR) 10 MG tablet Take 10 mg by mouth. Takes 1/2  at hs    [provider]  norethindrone-ethinyl estradiol-FE (LOESTRIN FE) 1-20 MG-MCG tablet Take 1 tablet by mouth daily. 03/17/21   Jerene Bears, MD  topiramate (TOPAMAX) 50 MG tablet Take 50 mg by mouth 2 (two) times daily.    [provider]  vitamin E 1000 UNIT capsule Take 1,000 Units by mouth daily.    [provider]      Allergies    Peanuts [peanut oil] and Prunus persica    Review of Systems   Review of Systems  Physical Exam Updated Vital Signs BP (!) 127/98   Pulse 67   Temp 98.2 F (36.8 C) (Oral)   Resp 16   SpO2 97%  Physical Exam  ED Results / Procedures / Treatments   Labs (all labs ordered are listed, but only abnormal results are displayed) Labs Reviewed - No data to display  EKG None  Radiology No results found.  Procedures Procedures  {Document cardiac monitor, telemetry assessment procedure when appropriate:1}  Medications Ordered in ED Medications - No data to display  ED Course/ Medical Decision Making/ A&P                           Medical Decision Making  ***  {Document critical care time when appropriate:1} {Document review of labs and clinical decision tools ie heart score, Chads2Vasc2 etc:1}  {Document your  independent review of radiology images, and any outside records:1} {Document your discussion with family members, caretakers, and with consultants:1} {Document social determinants of health affecting pt's care:1} {Document your decision making why or why not admission, treatments were needed:1} Final Clinical Impression(s) / ED Diagnoses Final diagnoses:  None    Rx / DC Orders ED Discharge Orders     None

## 2022-03-18 NOTE — ED Triage Notes (Signed)
Pt reports she bent down on Sunday to get a bottle of water and hurt her lower back. Denies falling or injury. Has lower back pain that radiates down L leg. Has been to chiropractor and PCP for this already. Still having pain.

## 2022-03-18 NOTE — Discharge Instructions (Addendum)
For your pain, you may take up to 1000mg  of acetaminophen (tylenol) 4 times daily for up to a week. This is the maximum dose of acetminophen (tylenol) you can take from all sources. Please check other over-the-counter medications and prescriptions to ensure you are not taking other medications that contain acetaminophen.  You may also take ibuprofen 400 mg 6 times a day OR 600mg  4 times a day alternating with or at the same time as tylenol.  Take oxycodone as needed for breakthrough pain.  This medication can be addicting, sedating and cause constipation.   You can take these medications in addition to the steroid I am prescribing, the methocarbamol and gabapentin you were prescribed.  I also recommend using a lidocaine patch.

## 2022-03-24 ENCOUNTER — Ambulatory Visit (HOSPITAL_BASED_OUTPATIENT_CLINIC_OR_DEPARTMENT_OTHER): Payer: BC Managed Care – PPO | Admitting: Obstetrics & Gynecology

## 2022-03-29 ENCOUNTER — Encounter (HOSPITAL_BASED_OUTPATIENT_CLINIC_OR_DEPARTMENT_OTHER): Payer: Self-pay | Admitting: Obstetrics & Gynecology

## 2022-03-29 ENCOUNTER — Ambulatory Visit (INDEPENDENT_AMBULATORY_CARE_PROVIDER_SITE_OTHER): Payer: BC Managed Care – PPO | Admitting: Obstetrics & Gynecology

## 2022-03-29 VITALS — BP 123/98 | HR 74 | Ht 63.0 in | Wt 180.2 lb

## 2022-03-29 DIAGNOSIS — N9089 Other specified noninflammatory disorders of vulva and perineum: Secondary | ICD-10-CM | POA: Diagnosis not present

## 2022-03-29 DIAGNOSIS — Z01419 Encounter for gynecological examination (general) (routine) without abnormal findings: Secondary | ICD-10-CM | POA: Diagnosis not present

## 2022-03-29 DIAGNOSIS — N951 Menopausal and female climacteric states: Secondary | ICD-10-CM

## 2022-03-29 DIAGNOSIS — G43009 Migraine without aura, not intractable, without status migrainosus: Secondary | ICD-10-CM

## 2022-03-29 NOTE — Progress Notes (Signed)
51 y.o. G0P0000 Married [place] or [place] American female here for annual exam.  Doing well.  On OCPs.  Cycles have been irregular since switching to Loestrin 1/20.  Last cycle was very light and flow lasted 2-3 days.  She was evaluated for possible auto-immune disorders this year.  ANA was elevated and CRP, non-cardiac, was elevated.  No definitive diagnosis was made.  No LMP recorded. (Menstrual status: Oral contraceptives).          Sexually active: Yes.    The current method of family planning is OCP (estrogen/progesterone).    Exercising: not right now due to back pain Smoker:  no  Health Maintenance: Pap:  03/15/2020 Negative/HR HPV neg History of abnormal Pap:  no MMG:  04/12/2021 Negative Colonoscopy:  2017 at [facility] BMD:   not indicated yet Screening Labs: reviewed in EPIC   reports that she has never smoked. She has never used smokeless tobacco. She reports that she does not drink alcohol and does not use drugs.  Past Medical History:  Diagnosis Date   History of anemia    Kidney stones    Migraine without aura    Multiple chemical sensitivity syndrome    Positive ANA (antinuclear antibody)     Past Surgical History:  Procedure Laterality Date   WISDOM TOOTH EXTRACTION      Current Outpatient Medications  Medication Sig Dispense Refill   cholecalciferol (VITAMIN D3) 25 MCG (1000 UNIT) tablet Take 1,000 Units by mouth daily.     Cyanocobalamin (VITAMIN B 12 PO) Take by mouth daily.     frovatriptan (FROVA) 2.5 MG tablet Take 2.5 mg by mouth as needed for migraine. If recurs, may repeat after 2 hours. Max of 3 tabs in 24 hours.     ibuprofen (ADVIL) 800 MG tablet Take 1 tablet (800 mg total) by mouth 3 (three) times daily. 21 tablet 0   methylPREDNISolone (MEDROL DOSEPAK) 4 MG TBPK tablet See instructions 21 each 0   montelukast (SINGULAIR) 10 MG tablet Take 10 mg by mouth. Takes 1/2 at hs     norethindrone-ethinyl estradiol-FE (LOESTRIN FE) 1-20 MG-MCG tablet Take 1  tablet by mouth daily. 84 tablet 3   oxyCODONE (ROXICODONE) 5 MG immediate release tablet Take 1 tablet (5 mg total) by mouth every 4 (four) hours as needed for severe pain. 20 tablet 0   topiramate (TOPAMAX) 50 MG tablet Take 50 mg by mouth 2 (two) times daily.     vitamin E 1000 UNIT capsule Take 1,000 Units by mouth daily.     No current facility-administered medications for this visit.    Family History  Problem Relation Age of Onset   Cancer Mother        gastric cancer   Hypertension Mother    Thyroid disease Mother        hypo   Diabetes Maternal Grandmother    Heart failure Maternal Grandmother    Ovarian cancer Paternal Grandmother    Cervical cancer Paternal Grandmother    Heart attack Cousin    Breast cancer Neg Hx     ROS: Constitutional: negative Genitourinary:negative  Exam:   BP (!) 123/98 (BP Location: Left Arm, Patient Position: Sitting, Cuff Size: Large)   Pulse 74   Ht 5\' 3"  (1.6 m) Comment: Reported  Wt 180 lb 3.2 oz (81.7 kg)   BMI 31.92 kg/m   Height: 5\' 3"  (160 cm) (Reported)  General appearance: alert, cooperative and appears stated age Head: Normocephalic, without obvious abnormality,  atraumatic Neck: no adenopathy, supple, symmetrical, trachea midline and thyroid normal to inspection and palpation Lungs: clear to auscultation bilaterally Breasts: normal appearance, no masses or tenderness Heart: regular rate and rhythm Abdomen: soft, non-tender; bowel sounds normal; no masses,  no organomegaly Extremities: extremities normal, atraumatic, no cyanosis or edema Skin: Skin color, texture, turgor normal. No rashes or lesions Lymph nodes: Cervical, supraclavicular, and axillary nodes normal. No abnormal inguinal nodes palpated Neurologic: Grossly normal   Pelvic: External genitalia:  inner left labia pigmented lesion 6 x 1mm              Urethra:  normal appearing urethra with no masses, tenderness or lesions              Bartholins and Skenes:  normal                 Vagina: normal appearing vagina with normal color and no discharge, no lesions              Cervix: no lesions              Pap taken: No. Bimanual Exam:  Uterus:  normal size, contour, position, consistency, mobility, non-tender              Adnexa: normal adnexa and no mass, fullness, tenderness               Rectovaginal: Confirms               Anus:  normal sphincter tone, no lesions  Chaperone, Ina Homes, CMA, was present for exam.  Assessment/Plan: 1. Well woman exam with routine gynecological exam - Pap smear neg 2022/neg HR HPV - Mammogram 03/2021 - Colonoscopy 2017.  Have reached out to GI to see when next one is due. - Bone mineral density not indicated - lab work done done with PCP - vaccines reviewed/updated  2. Perimenopausal - will check hormonal status today - Anti mullerian hormone  3. Migraine without aura and without status migrainosus, not intractable - followed by Dr. Charmayne Sheer  4.  Labial lesion - will follow and if changes, will plan to biopsy or remove

## 2022-04-04 LAB — ANTI MULLERIAN HORMONE: ANTI-MULLERIAN HORMONE (AMH): <0.015 ng/mL

## 2022-04-13 ENCOUNTER — Ambulatory Visit
Admission: RE | Admit: 2022-04-13 | Discharge: 2022-04-13 | Disposition: A | Discharge status: Home / Self Care | Payer: BC Managed Care – PPO | Source: Ambulatory Visit | Attending: Obstetrics & Gynecology | Admitting: Obstetrics & Gynecology

## 2022-04-13 DIAGNOSIS — Z1231 Encounter for screening mammogram for malignant neoplasm of breast: Secondary | ICD-10-CM

## 2022-04-28 ENCOUNTER — Encounter (HOSPITAL_BASED_OUTPATIENT_CLINIC_OR_DEPARTMENT_OTHER): Payer: Self-pay | Admitting: Obstetrics & Gynecology

## 2022-07-18 ENCOUNTER — Telehealth (HOSPITAL_BASED_OUTPATIENT_CLINIC_OR_DEPARTMENT_OTHER): Payer: Self-pay

## 2022-07-18 NOTE — Telephone Encounter (Signed)
Patient called today. She would like to schedule a virtual visit with Dr. Hyacinth Meeker. She states in her last visit Dr. Hyacinth Meeker gave her the option to stop current birth control or change birth controls. She decided to stop the birth control. She would like a virtual appointment to discuss the changes her body has gone through since stopping the birth control. She would also like to discuss what options she has going forward.  Please schedule patient a virtual visit. Thanks

## 2022-07-20 ENCOUNTER — Ambulatory Visit (INDEPENDENT_AMBULATORY_CARE_PROVIDER_SITE_OTHER): Payer: BC Managed Care – PPO | Admitting: Obstetrics & Gynecology

## 2022-07-20 ENCOUNTER — Telehealth (HOSPITAL_BASED_OUTPATIENT_CLINIC_OR_DEPARTMENT_OTHER): Payer: BC Managed Care – PPO | Admitting: Obstetrics & Gynecology

## 2022-07-20 ENCOUNTER — Encounter (HOSPITAL_BASED_OUTPATIENT_CLINIC_OR_DEPARTMENT_OTHER): Payer: Self-pay | Admitting: Obstetrics & Gynecology

## 2022-07-20 VITALS — BP 130/96 | HR 69 | Ht 63.0 in | Wt 189.2 lb

## 2022-07-20 DIAGNOSIS — N912 Amenorrhea, unspecified: Secondary | ICD-10-CM | POA: Diagnosis not present

## 2022-07-21 LAB — FOLLICLE STIMULATING HORMONE: FSH: 77.9 m[IU]/mL

## 2022-07-21 NOTE — Progress Notes (Signed)
GYNECOLOGY  VISIT  CC:   discuss need for contraception  HPI: 51 y.o. G0P0000 Married [place] or [place] American female here for concerns about needs for contraception.  Pt has been on OCPs for years.  Cycles had become quite short and light.  AMH done at last office visit in the summer and was undetectable.  OCPs stopped and pt had one very light cycle afterwards.  She's had no bleeding since.  She is concerned about risks for pregnancy.  As well, she reports she was having significant hot flashes but when she stopped the OCPs, this resolved.  She wasn't sure if this was normal or not and wanted to address this today.    AMH levels discussed and she understands now that this indicated menopause. She does want to be sure about this and is concerned about pregnancy.  She does want confirmation.  Will obtain [facility] today.  Also reassured about her perimenopausal symptoms.  Discussed these might return but for now, ok to monitor.   Past Medical History:  Diagnosis Date   History of anemia    Kidney stones    Migraine without aura    Multiple chemical sensitivity syndrome    Positive ANA (antinuclear antibody)     MEDS:   Current Outpatient Medications on File Prior to Visit  Medication Sig Dispense Refill   cholecalciferol (VITAMIN D3) 25 MCG (1000 UNIT) tablet Take 1,000 Units by mouth daily.     Cyanocobalamin (VITAMIN B 12 PO) Take by mouth daily.     frovatriptan (FROVA) 2.5 MG tablet Take 2.5 mg by mouth as needed for migraine. If recurs, may repeat after 2 hours. Max of 3 tabs in 24 hours.     ibuprofen (ADVIL) 800 MG tablet Take 1 tablet (800 mg total) by mouth 3 (three) times daily. 21 tablet 0   montelukast (SINGULAIR) 10 MG tablet Take 10 mg by mouth. Takes 1/2 at hs     norethindrone-ethinyl estradiol-FE (LOESTRIN FE) 1-20 MG-MCG tablet Take 1 tablet by mouth daily. 84 tablet 3   topiramate (TOPAMAX) 50 MG tablet Take 50 mg by mouth 2 (two) times daily.     vitamin E 1000 UNIT  capsule Take 1,000 Units by mouth daily.     No current facility-administered medications on file prior to visit.    ALLERGIES: Peanuts [peanut oil] and Prunus persica  SH:  married, non smoker  Review of Systems  Constitutional: Negative.     PHYSICAL EXAMINATION:    BP (!) 130/96 (BP Location: Left Arm, Patient Position: Sitting, Cuff Size: Large)   Pulse 69   Ht 5\' 3"  (1.6 m) Comment: Reported  Wt 189 lb 3.2 oz (85.8 kg)   BMI 33.52 kg/m     General appearance: alert, cooperative and appears stated age  Assessment/Plan: 1. Amenorrhea - obtain FSH today and review results with pt afterwards - Follicle stimulating hormone  Total time with pt:  16 minutes.  Time with documentation:  5 minutes.  Total time with pt and documentation 21 minutes.

## 2022-07-22 ENCOUNTER — Encounter (HOSPITAL_BASED_OUTPATIENT_CLINIC_OR_DEPARTMENT_OTHER): Payer: Self-pay | Admitting: Obstetrics & Gynecology

## 2022-11-07 ENCOUNTER — Other Ambulatory Visit (HOSPITAL_COMMUNITY): Payer: Self-pay

## 2022-12-06 ENCOUNTER — Encounter (HOSPITAL_BASED_OUTPATIENT_CLINIC_OR_DEPARTMENT_OTHER): Payer: Self-pay | Admitting: Obstetrics & Gynecology

## 2023-02-27 ENCOUNTER — Other Ambulatory Visit: Payer: Self-pay | Admitting: Obstetrics & Gynecology

## 2023-02-27 DIAGNOSIS — Z1231 Encounter for screening mammogram for malignant neoplasm of breast: Secondary | ICD-10-CM

## 2023-04-09 ENCOUNTER — Ambulatory Visit (HOSPITAL_BASED_OUTPATIENT_CLINIC_OR_DEPARTMENT_OTHER): Payer: BC Managed Care – PPO | Admitting: Obstetrics & Gynecology

## 2023-04-10 ENCOUNTER — Ambulatory Visit (INDEPENDENT_AMBULATORY_CARE_PROVIDER_SITE_OTHER): Payer: BC Managed Care – PPO | Admitting: Obstetrics & Gynecology

## 2023-04-10 ENCOUNTER — Encounter (HOSPITAL_BASED_OUTPATIENT_CLINIC_OR_DEPARTMENT_OTHER): Payer: Self-pay | Admitting: Obstetrics & Gynecology

## 2023-04-10 ENCOUNTER — Other Ambulatory Visit (HOSPITAL_COMMUNITY)
Admission: RE | Admit: 2023-04-10 | Discharge: 2023-04-10 | Disposition: A | Discharge status: Home / Self Care | Payer: BC Managed Care – PPO | Source: Ambulatory Visit | Attending: Obstetrics & Gynecology | Admitting: Obstetrics & Gynecology

## 2023-04-10 VITALS — BP 132/82 | HR 63 | Ht 63.0 in | Wt 194.4 lb

## 2023-04-10 DIAGNOSIS — Z124 Encounter for screening for malignant neoplasm of cervix: Secondary | ICD-10-CM | POA: Diagnosis present

## 2023-04-10 DIAGNOSIS — N9089 Other specified noninflammatory disorders of vulva and perineum: Secondary | ICD-10-CM | POA: Diagnosis not present

## 2023-04-10 DIAGNOSIS — N950 Postmenopausal bleeding: Secondary | ICD-10-CM | POA: Diagnosis not present

## 2023-04-10 DIAGNOSIS — G43009 Migraine without aura, not intractable, without status migrainosus: Secondary | ICD-10-CM | POA: Diagnosis not present

## 2023-04-10 DIAGNOSIS — Z01419 Encounter for gynecological examination (general) (routine) without abnormal findings: Secondary | ICD-10-CM

## 2023-04-10 NOTE — Progress Notes (Addendum)
52 y.o. G0P0000 Married [place] or [place] American female here for annual exam.  Doing well.   Does having some chronic lower pain.  She had an epidural placed two weeks ago.  This has helped some but she is still having some leg pain.    She is having some very light bleeding that started Sunday.  She is using a panty liner.    Patient's last menstrual period was 04/01/2023 (exact date).          Sexually active: Yes.    The current method of family planning is post menopausal status.     Upstream - 04/10/23 0905       Pregnancy Intention Screening   Does the patient want to become pregnant in the next year? No    Does the patient's partner want to become pregnant in the next year? No    Would the patient like to discuss contraceptive options today? No           Exercising: Was unable to exercise prior to having the epidural placed. Smoker:  no  Health Maintenance: Pap:  03/15/2020 Normal History of abnormal Pap:  no MMG:  04/13/22 Normal Colonoscopy:  11/2015, follow up 10 years Screening Labs: 02/2022   reports that she has never smoked. She has never used smokeless tobacco. She reports that she does not drink alcohol and does not use drugs.  Past Medical History:  Diagnosis Date   History of anemia    Kidney stones    Migraine without aura    Multiple chemical sensitivity syndrome    Positive ANA (antinuclear antibody)     Past Surgical History:  Procedure Laterality Date   WISDOM TOOTH EXTRACTION      Current Outpatient Medications  Medication Sig Dispense Refill   cholecalciferol (VITAMIN D3) 25 MCG (1000 UNIT) tablet Take 1,000 Units by mouth daily.     Cyanocobalamin (VITAMIN B 12 PO) Take by mouth daily.     frovatriptan (FROVA) 2.5 MG tablet Take 2.5 mg by mouth as needed for migraine. If recurs, may repeat after 2 hours. Max of 3 tabs in 24 hours.     ibuprofen (ADVIL) 800 MG tablet Take 1 tablet (800 mg total) by mouth 3 (three) times daily. 21 tablet 0    montelukast (SINGULAIR) 10 MG tablet Take 10 mg by mouth. Takes 1/2 at hs     vitamin E 1000 UNIT capsule Take 1,000 Units by mouth daily.     No current facility-administered medications for this visit.    Family History  Problem Relation Age of Onset   Cancer Mother        gastric cancer   Hypertension Mother    Thyroid disease Mother        hypo   Diabetes Maternal Grandmother    Heart failure Maternal Grandmother    Ovarian cancer Paternal Grandmother    Cervical cancer Paternal Grandmother    Heart attack Cousin    Breast cancer Neg Hx     ROS: Constitutional: negative Genitourinary:negative and light bleeding  Exam:   BP 132/82 (BP Location: Left Arm, Patient Position: Sitting, Cuff Size: Large)   Pulse 63   Ht 5\' 3"  (1.6 m)   Wt 194 lb 6.4 oz (88.2 kg)   LMP 04/01/2023 (Exact Date)   BMI 34.44 kg/m   Height: 5\' 3"  (160 cm)  General appearance: alert, cooperative and appears stated age Head: Normocephalic, without obvious abnormality, atraumatic Neck: no adenopathy, supple, symmetrical,  trachea midline and thyroid normal to inspection and palpation Lungs: clear to auscultation bilaterally Breasts: normal appearance, no masses or tenderness Heart: regular rate and rhythm Abdomen: soft, non-tender; bowel sounds normal; no masses,  no organomegaly Extremities: extremities normal, atraumatic, no cyanosis or edema Skin: Skin color, texture, turgor normal. No rashes or lesions Lymph nodes: Cervical, supraclavicular, and axillary nodes normal. No abnormal inguinal nodes palpated Neurologic: Grossly normal   Pelvic: External genitalia:  inner left labial lesion, darkly pigmented, 5mm x 2mm              Urethra:  normal appearing urethra with no masses, tenderness or lesions              Bartholins and Skenes: normal                 Vagina: normal appearing vagina with normal color and no discharge, no lesions              Cervix: no lesions              Pap taken:  Yes.   Bimanual Exam:  Uterus:  normal size, contour, position, consistency, mobility, non-tender              Adnexa: normal adnexa and no mass, fullness, tenderness               Rectovaginal: Confirms               Anus:  normal sphincter tone, no lesions  Chaperone, Hendricks Milo, CMA, was present for exam.  Assessment/Plan: 1. Well woman exam with routine gynecological exam - Pap smear and HR HPV obtained today - Mammogram 03/2022 - Colonoscopy 2017.  Follow up 10 years - Bone mineral density not due yet - lab work:  [facility] ordered - vaccines reviewed/updated  2. Cervical cancer screening - Cytology - PAP( [facility])  3. Postmenopausal bleeding - Follicle stimulating hormone - if this is elevated, will plan ultrasound  4. Migraine without aura and responsive to treatment Ibuprofen  5. Vulvar lesion - stable on exam today.  Do not feel needs biopsy as had not had any change.

## 2023-04-11 LAB — FOLLICLE STIMULATING HORMONE: FSH: 75.4 m[IU]/mL

## 2023-04-11 NOTE — Addendum Note (Signed)
Addended by: Jerene Bears on: 04/11/2023 03:10 PM   Modules accepted: Orders

## 2023-04-12 LAB — CYTOLOGY - PAP
Comment: NEGATIVE
Diagnosis: NEGATIVE
High risk HPV: NEGATIVE

## 2023-04-17 ENCOUNTER — Ambulatory Visit
Admission: RE | Admit: 2023-04-17 | Discharge: 2023-04-17 | Disposition: A | Discharge status: Home / Self Care | Payer: BC Managed Care – PPO | Source: Ambulatory Visit | Attending: Obstetrics & Gynecology | Admitting: Obstetrics & Gynecology

## 2023-04-17 DIAGNOSIS — Z1231 Encounter for screening mammogram for malignant neoplasm of breast: Secondary | ICD-10-CM

## 2023-05-02 ENCOUNTER — Encounter (HOSPITAL_BASED_OUTPATIENT_CLINIC_OR_DEPARTMENT_OTHER): Payer: Self-pay | Admitting: Obstetrics & Gynecology

## 2023-05-02 ENCOUNTER — Ambulatory Visit (HOSPITAL_BASED_OUTPATIENT_CLINIC_OR_DEPARTMENT_OTHER): Payer: BC Managed Care – PPO

## 2023-05-02 ENCOUNTER — Ambulatory Visit (HOSPITAL_BASED_OUTPATIENT_CLINIC_OR_DEPARTMENT_OTHER): Payer: BC Managed Care – PPO | Admitting: Obstetrics & Gynecology

## 2023-05-02 VITALS — BP 136/88 | HR 69 | Ht 63.0 in | Wt 200.2 lb

## 2023-05-02 DIAGNOSIS — N950 Postmenopausal bleeding: Secondary | ICD-10-CM

## 2023-05-02 NOTE — Patient Instructions (Addendum)
Non hormonal options.  Use once or twice weekly.  Coconut oil twice weekly Replens vaginal moisturizer once or twice weekly Revaree - vaginal hyaluronic acid suppository twice weekly (better for tightness/dryness Vit E vaginal suppositories   Lubricant to use with intercourse:  astroglide   Hormonal:  Estradiol cream -- twice weekly (prescription)

## 2023-05-07 ENCOUNTER — Other Ambulatory Visit (HOSPITAL_BASED_OUTPATIENT_CLINIC_OR_DEPARTMENT_OTHER): Payer: Self-pay

## 2023-05-07 MED ORDER — COMIRNATY 30 MCG/0.3ML IM SUSY
0.3000 mL | PREFILLED_SYRINGE | Freq: Once | INTRAMUSCULAR | 0 refills | Status: AC
  Filled 2023-05-07: qty 0.3, 1d supply, fill #0

## 2023-05-18 NOTE — Progress Notes (Signed)
GYNECOLOGY  VISIT  CC:   Discuss ultrasound results, h/o PMP bleeding  HPI: 52 y.o. G0P0000 Married [place] or [place] American female here for discussion of ultrasound results.  Ultrasound showed uterus measuring 6.3 x 3.0 x 2.6cm with 1.78mm endometrium.  No fibroids present.  Pt aware endometrium is very thin and reassuring.  Ovaries were normal except for a simple 2.4cm follicle on the left ovary which has no worrisome features.  No free fluid noted in PCDS.  Given ultrasound findings, do not feel endometrial biopsy is needed.  However, if she does have any future bleeding, would recommend proceeding with biopsy at that time.  Encouraged her to let me know if this occurs.     Past Medical History:  Diagnosis Date   History of anemia    Kidney stones    Migraine without aura    Multiple chemical sensitivity syndrome    Positive ANA (antinuclear antibody)     MEDS:   Current Outpatient Medications on File Prior to Visit  Medication Sig Dispense Refill   cholecalciferol (VITAMIN D3) 25 MCG (1000 UNIT) tablet Take 1,000 Units by mouth daily.     Cyanocobalamin (VITAMIN B 12 PO) Take by mouth daily.     frovatriptan (FROVA) 2.5 MG tablet Take 2.5 mg by mouth as needed for migraine. If recurs, may repeat after 2 hours. Max of 3 tabs in 24 hours.     ibuprofen (ADVIL) 800 MG tablet Take 1 tablet (800 mg total) by mouth 3 (three) times daily. 21 tablet 0   montelukast (SINGULAIR) 10 MG tablet Take 10 mg by mouth. Takes 1/2 at hs     vitamin E 1000 UNIT capsule Take 1,000 Units by mouth daily.     No current facility-administered medications on file prior to visit.    ALLERGIES: Peanuts [peanut oil] and Prunus persica  SH:  married, non smoker  Review of Systems  Constitutional: Negative.   Genitourinary: Negative.     PHYSICAL EXAMINATION:    BP 136/88   Pulse 69   Ht 5\' 3"  (1.6 m)   Wt 200 lb 3.2 oz (90.8 kg)   LMP 04/01/2023 (Exact Date)   BMI 35.46 kg/m      Physical  Exam Constitutional:      Appearance: Normal appearance.  Pulmonary:     Effort: Pulmonary effort is normal.  Neurological:     General: No focal deficit present.  Psychiatric:        Mood and Affect: Mood normal.     Assessment/Plan: 1. Postmenopausal bleeding - very reassuring ultrasound obtained today.  Will follow conservatively for now but would proceed with endometrial sampling if there is any future bleeding.

## 2023-07-16 ENCOUNTER — Encounter (HOSPITAL_BASED_OUTPATIENT_CLINIC_OR_DEPARTMENT_OTHER): Payer: Self-pay | Admitting: Obstetrics & Gynecology

## 2023-07-31 ENCOUNTER — Encounter (HOSPITAL_BASED_OUTPATIENT_CLINIC_OR_DEPARTMENT_OTHER): Payer: Self-pay | Admitting: Obstetrics & Gynecology

## 2023-07-31 ENCOUNTER — Ambulatory Visit (HOSPITAL_BASED_OUTPATIENT_CLINIC_OR_DEPARTMENT_OTHER): Payer: BC Managed Care – PPO | Admitting: Obstetrics & Gynecology

## 2023-07-31 VITALS — BP 142/73 | HR 69 | Ht 63.0 in | Wt 209.6 lb

## 2023-07-31 DIAGNOSIS — R232 Flushing: Secondary | ICD-10-CM

## 2023-07-31 DIAGNOSIS — Z6837 Body mass index (BMI) 37.0-37.9, adult: Secondary | ICD-10-CM | POA: Diagnosis not present

## 2023-07-31 MED ORDER — GABAPENTIN 100 MG PO CAPS: ORAL_CAPSULE | ORAL | 0 refills | Status: DC

## 2023-07-31 NOTE — Progress Notes (Signed)
 GYNECOLOGY  VISIT  CC:   menopausal symptoms  HPI: 52 y.o. G0P0000 Married Black or African American female here for discussion of menopausal symptoms.  Has not had any bleeding in several months.  Major symptoms is hot flashes and night sweats.  Feels like she is gaining weight as well.  Still having some lower back issues, still.  Last epidural was the end of August.  Has not noticed any vaginal dryness but she has been using lubricant.  She has hx of migraines without aura.  Last migraine was about a month ago.    Options for treatment discussed including HRT, Veozah, Gabapentin  and SSRI/SNRI use.  Dosages, administration, side effects, and risks reviewed.  She would like to try gabapentin  as she is having back pain issues as well.  Will titrate this up by 100mg .    Also would like to plan some blood work.  Hasn't seen PCP this year.  Has eaten very little today.    Past Medical History:  Diagnosis Date   History of anemia    Kidney stones    Migraine without aura    Multiple chemical sensitivity syndrome    Positive ANA (antinuclear antibody)     MEDS:   Current Outpatient Medications on File Prior to Visit  Medication Sig Dispense Refill   cholecalciferol (VITAMIN D3) 25 MCG (1000 UNIT) tablet Take 1,000 Units by mouth daily.     Cyanocobalamin (VITAMIN B 12 PO) Take by mouth daily.     frovatriptan (FROVA) 2.5 MG tablet Take 2.5 mg by mouth as needed for migraine. If recurs, may repeat after 2 hours. Max of 3 tabs in 24 hours.     ibuprofen  (ADVIL ) 800 MG tablet Take 1 tablet (800 mg total) by mouth 3 (three) times daily. 21 tablet 0   montelukast (SINGULAIR) 10 MG tablet Take 10 mg by mouth. Takes 1/2 at hs     vitamin E 1000 UNIT capsule Take 1,000 Units by mouth daily.     No current facility-administered medications on file prior to visit.    ALLERGIES: Peanuts [peanut oil] and Prunus persica  SH:  married, non smoker  Review of Systems  Constitutional: Negative.         Hot flashes    PHYSICAL EXAMINATION:    BP (!) 142/73 (BP Location: Left Arm, Patient Position: Sitting, Cuff Size: Large)   Pulse 69   Ht 5' 3 (1.6 m) Comment: Reported  Wt 209 lb 9.6 oz (95.1 kg)   BMI 37.13 kg/m     Physical Exam Constitutional:      Appearance: Normal appearance.  Neurological:     General: No focal deficit present.     Mental Status: She is alert.  Psychiatric:        Mood and Affect: Mood normal.      Assessment/Plan: 1. Hot flashes (Primary) - will try gabapentin  to start with right now.   - gabapentin  (NEURONTIN ) 100 MG capsule; Take 1 capsule nightly x 3 nights (100mg ).  Can increase to 2 capsules (200mg ) nightly x 3 nights.  Then increase to 3 capsules (300mg ) nightly.  Dispense: 60 capsule; Refill: 0  2. BMI 37.0-37.9, adult - Comprehensive metabolic panel; Future - Hemoglobin A1c; Future - TSH; Future - Lipid panel; Future

## 2023-08-01 LAB — COMPREHENSIVE METABOLIC PANEL
ALT: 22 [IU]/L (ref 0–32)
AST: 22 [IU]/L (ref 0–40)
Albumin: 4.3 g/dL (ref 3.8–4.9)
Alkaline Phosphatase: 73 [IU]/L (ref 44–121)
BUN/Creatinine Ratio: 16 (ref 9–23)
BUN: 12 mg/dL (ref 6–24)
Bilirubin Total: 0.6 mg/dL (ref 0.0–1.2)
CO2: 24 mmol/L (ref 20–29)
Calcium: 10.3 mg/dL — ABNORMAL HIGH (ref 8.7–10.2)
Chloride: 101 mmol/L (ref 96–106)
Creatinine, Ser: 0.73 mg/dL (ref 0.57–1.00)
Globulin, Total: 2.8 g/dL (ref 1.5–4.5)
Glucose: 82 mg/dL (ref 70–99)
Potassium: 4.4 mmol/L (ref 3.5–5.2)
Sodium: 139 mmol/L (ref 134–144)
Total Protein: 7.1 g/dL (ref 6.0–8.5)
eGFR: 100 mL/min/{1.73_m2} (ref >59)

## 2023-08-01 LAB — LIPID PANEL
Chol/HDL Ratio: 2.8 {ratio} (ref 0.0–4.4)
Cholesterol, Total: 147 mg/dL (ref 100–199)
HDL: 52 mg/dL (ref >39)
LDL Chol Calc (NIH): 78 mg/dL (ref 0–99)
Triglycerides: 93 mg/dL (ref 0–149)
VLDL Cholesterol Cal: 17 mg/dL (ref 5–40)

## 2023-08-01 LAB — TSH: TSH: 1.1 u[IU]/mL (ref 0.450–4.500)

## 2023-08-01 LAB — HEMOGLOBIN A1C
Est. average glucose Bld gHb Est-mCnc: 117 mg/dL
Hgb A1c MFr Bld: 5.7 % — ABNORMAL HIGH (ref 4.8–5.6)

## 2023-08-04 IMAGING — MG DIGITAL DIAGNOSTIC BILAT W/ TOMO W/ CAD
8 series · 8 of 24 positions shown · non-contrast
Comparison: Previous exam(s).

CLINICAL DATA: Short-term follow-up for a probably benign mass in
the right breast, initially assessed on 04/09/2020.

EXAM:
DIGITAL DIAGNOSTIC BILATERAL MAMMOGRAM WITH TOMOSYNTHESIS AND CAD;
ULTRASOUND RIGHT BREAST LIMITED
TECHNIQUE: Bilateral digital diagnostic mammography and breast tomosynthesis
was performed. The images were evaluated with computer-aided
detection.; Targeted ultrasound examination of the right breast was
performed

[L CC synth-2D]
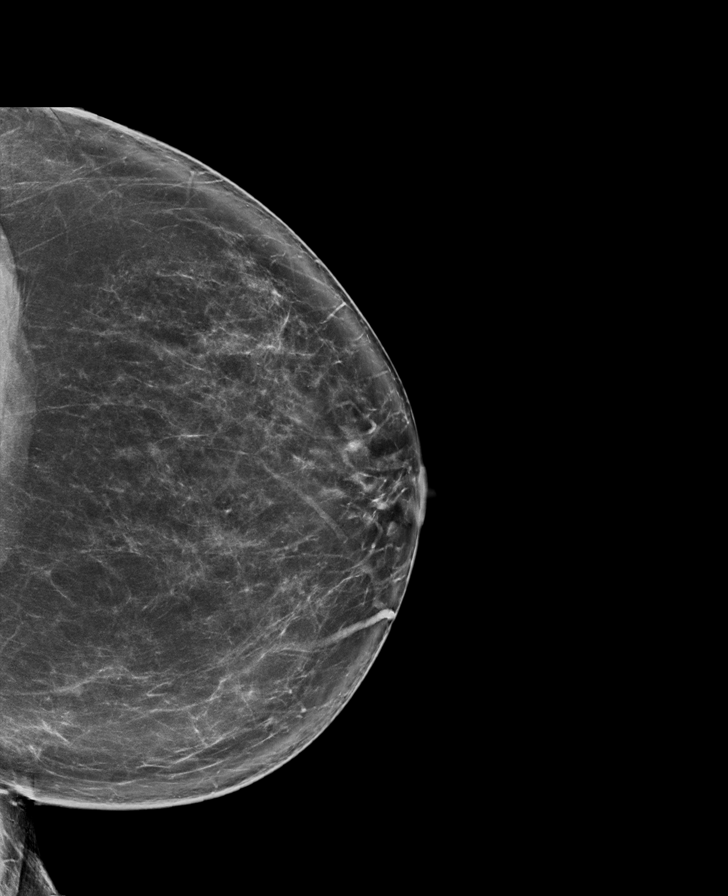

[R MLO synth-2D]
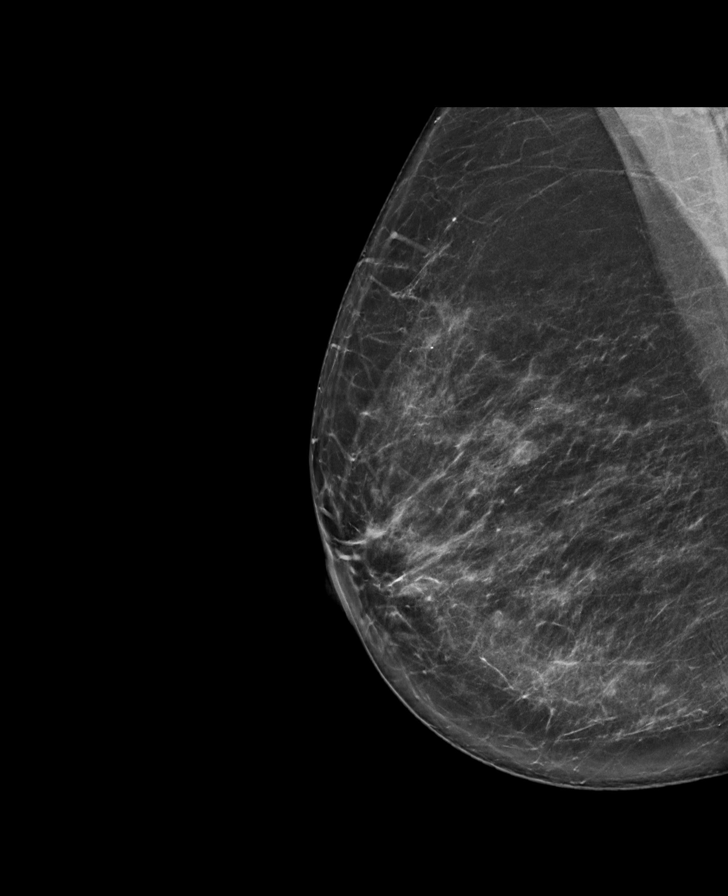

[R CC synth-2D]
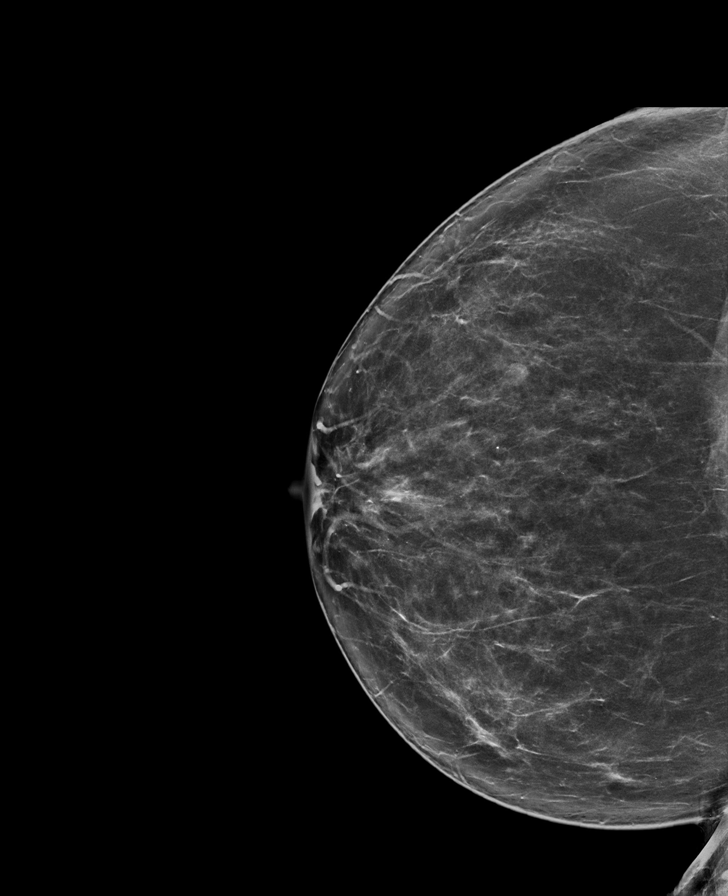

[L MLO synth-2D]
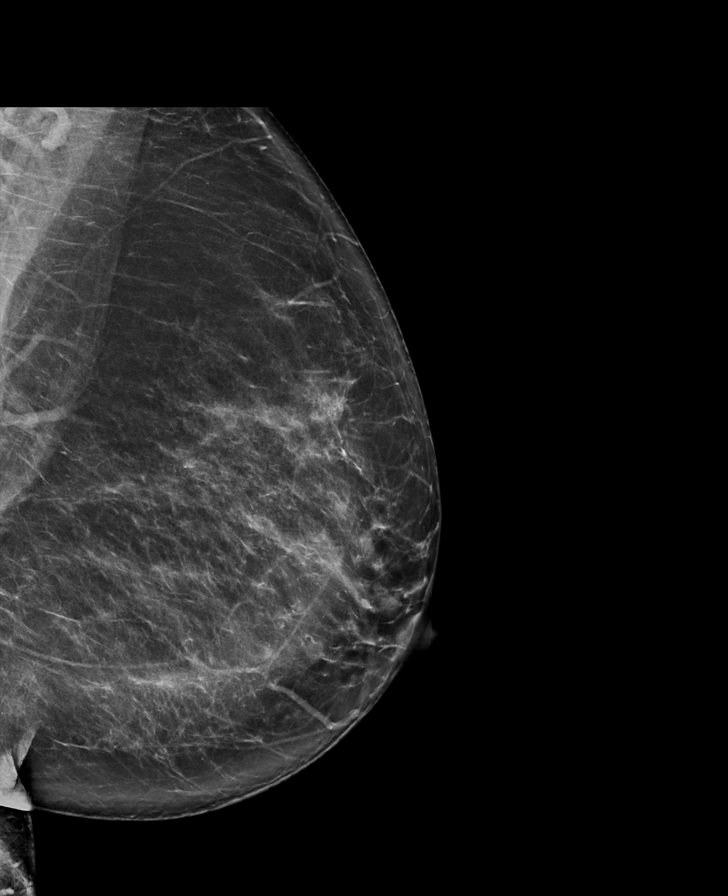

[R CC tomo · tomo slice 43/85.0]
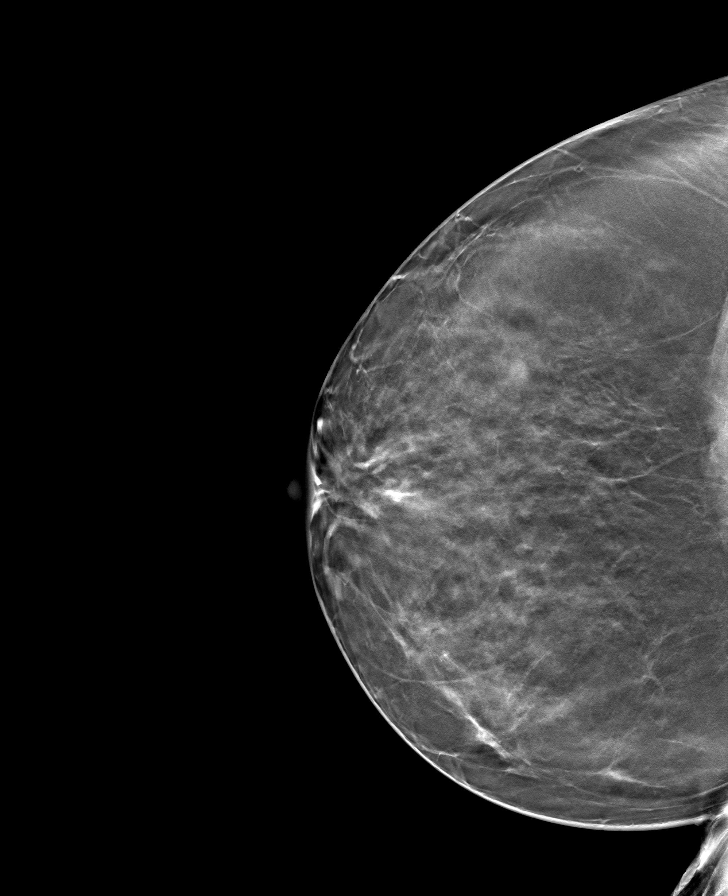

[R MLO tomo · tomo slice 44/87.0]
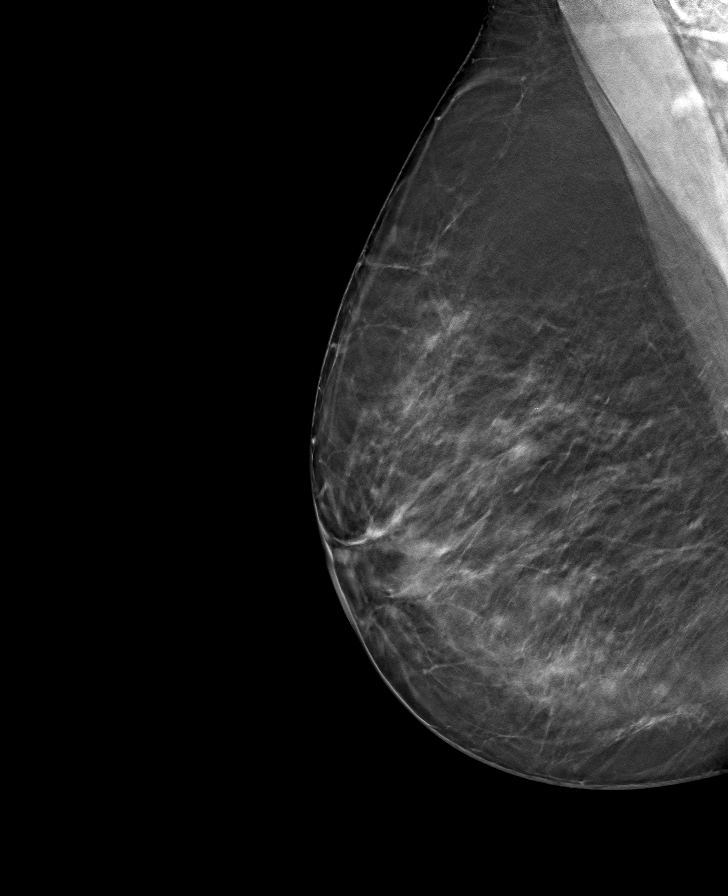

[L MLO tomo · tomo slice 45/88.0]
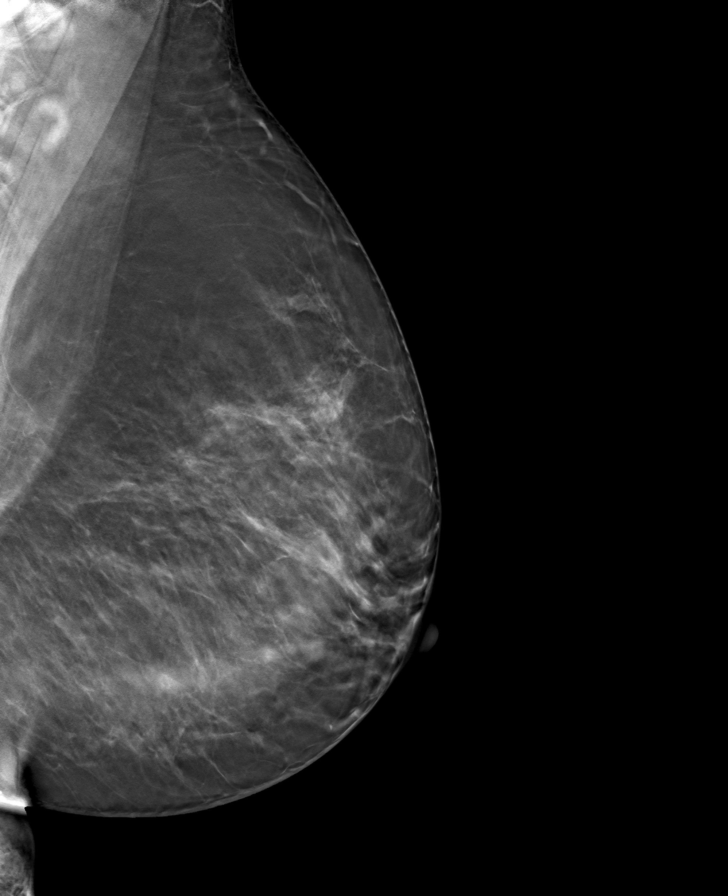

[L CC tomo · tomo slice 45/89.0]
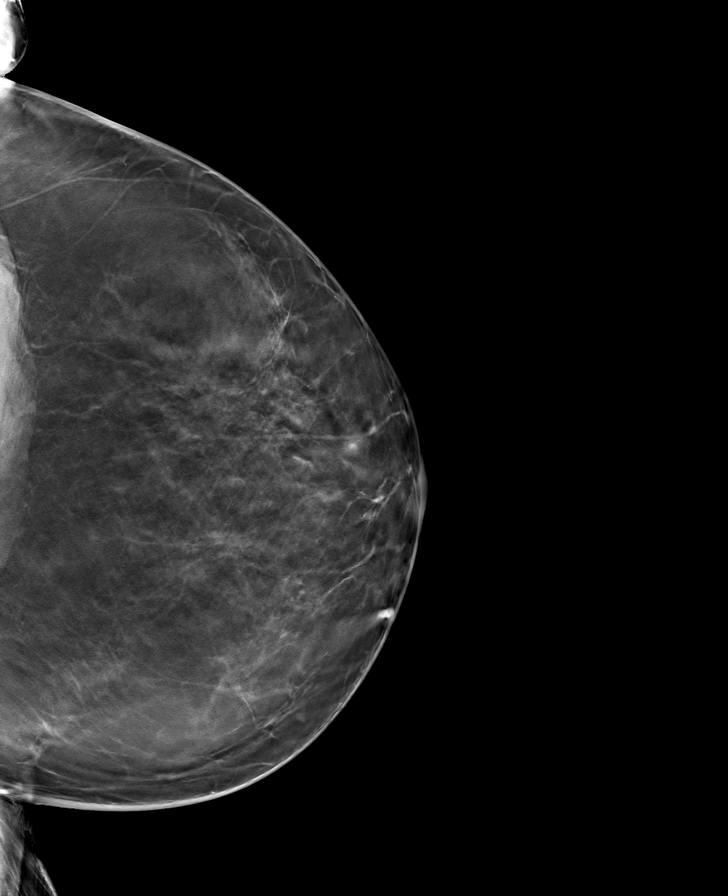

[8 of 24 positions shown; findings below may reference images not displayed]

ACR Breast Density Category b: There are scattered areas of
fibroglandular density.
FINDINGS: The partly obscured mass in the lateral right breast is unchanged.
There are no new masses, no areas of architectural distortion, no
significant areas of asymmetry and no suspicious calcifications.

Targeted right breast ultrasound is performed, showing an oval cyst,
without complicating features, in the right breast at 9 o'clock, 4
cm from the nipple, measuring 10 x 4 x 6 mm. This initially measured
7 x 3 x 6 mm, and is more clearly a simple cyst on the current exam.
IMPRESSION: 1. No evidence of breast malignancy.
2. Benign right breast cyst.

RECOMMENDATION:
Screening mammogram in one year.(Code:SP-Z-G5M)

I have discussed the findings and recommendations with the patient.
If applicable, a reminder letter will be sent to the patient
regarding the next appointment.

BI-RADS CATEGORY  2: Benign.

## 2023-08-04 IMAGING — US US BREAST*R* LIMITED INC AXILLA
1 series · 7 of 7 positions shown · non-contrast
Comparison: Previous exam(s).

CLINICAL DATA: Short-term follow-up for a probably benign mass in
the right breast, initially assessed on 04/09/2020.

EXAM:
DIGITAL DIAGNOSTIC BILATERAL MAMMOGRAM WITH TOMOSYNTHESIS AND CAD;
ULTRASOUND RIGHT BREAST LIMITED
TECHNIQUE: Bilateral digital diagnostic mammography and breast tomosynthesis
was performed. The images were evaluated with computer-aided
detection.; Targeted ultrasound examination of the right breast was
performed

[Series 1: us breast*right* limited inc axilla · 0.07mm/px · 7 of 7 slices shown]
[im 1/7]
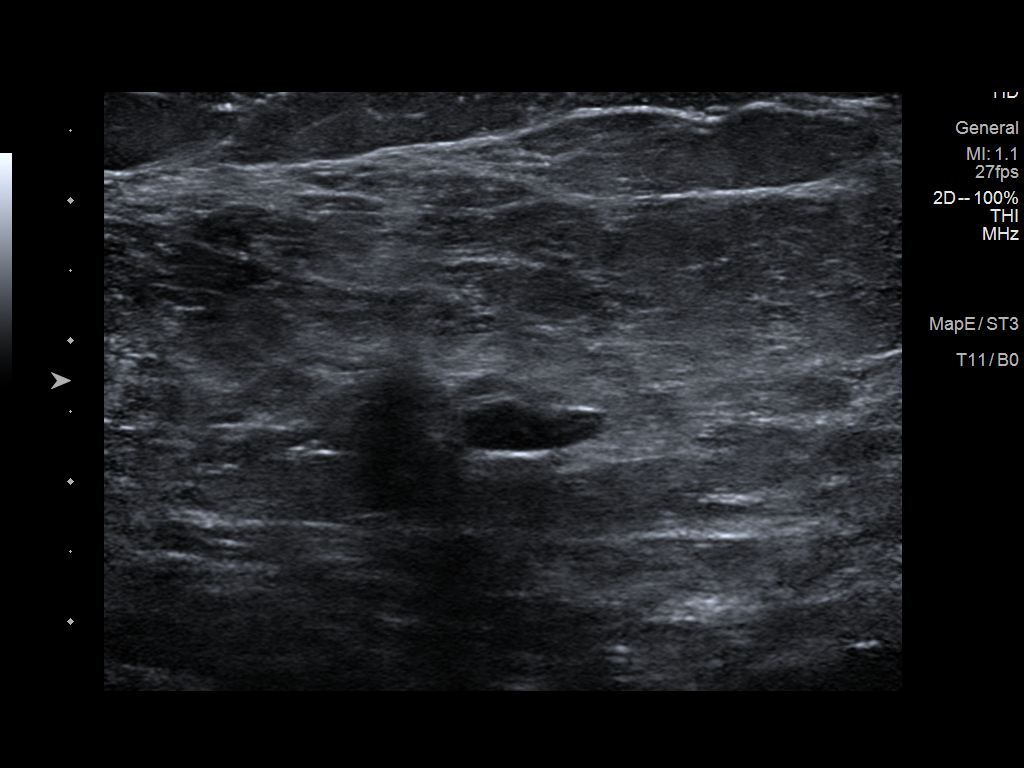
[im 2/7]
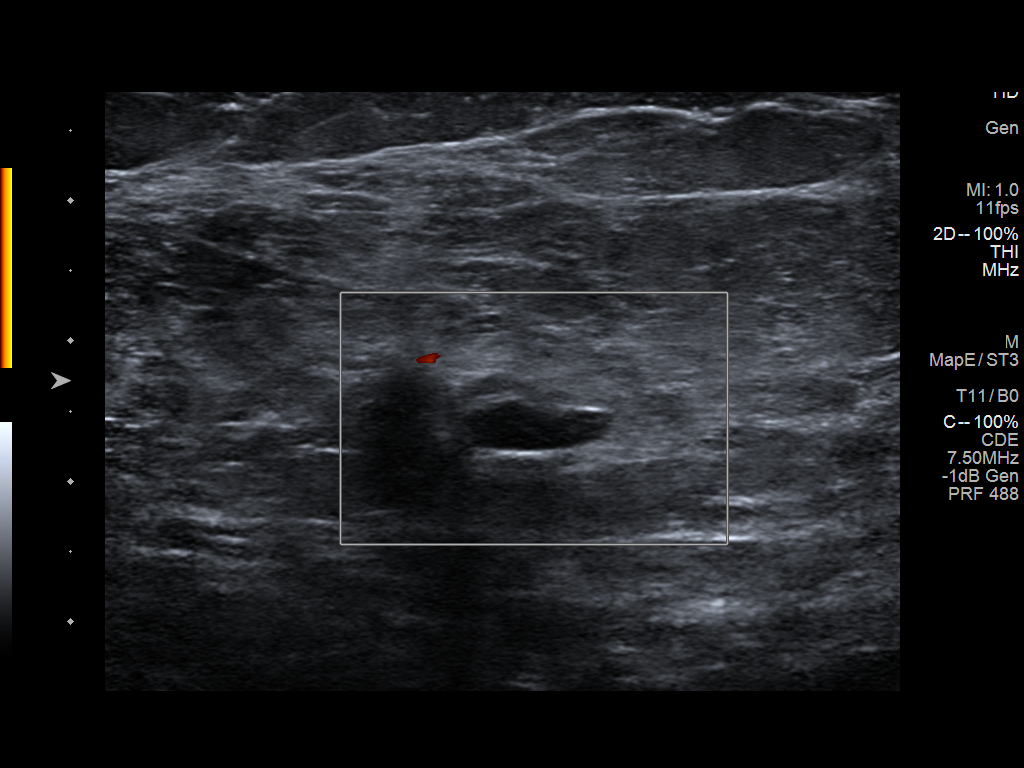
[im 3/7]
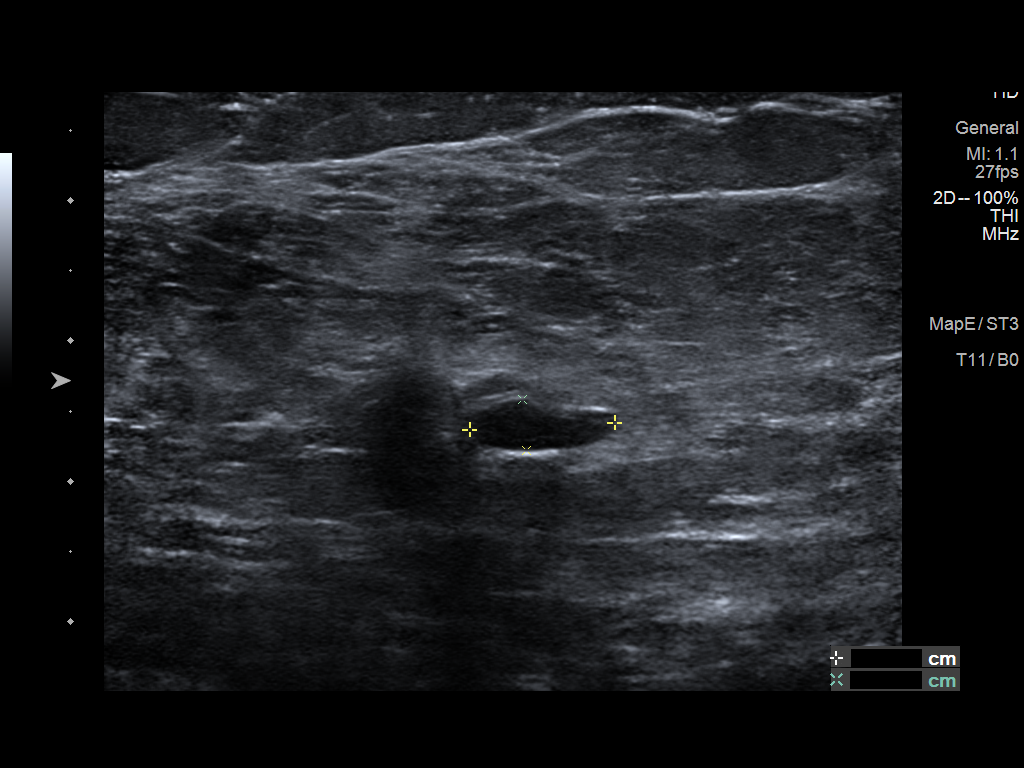
[im 4/7]
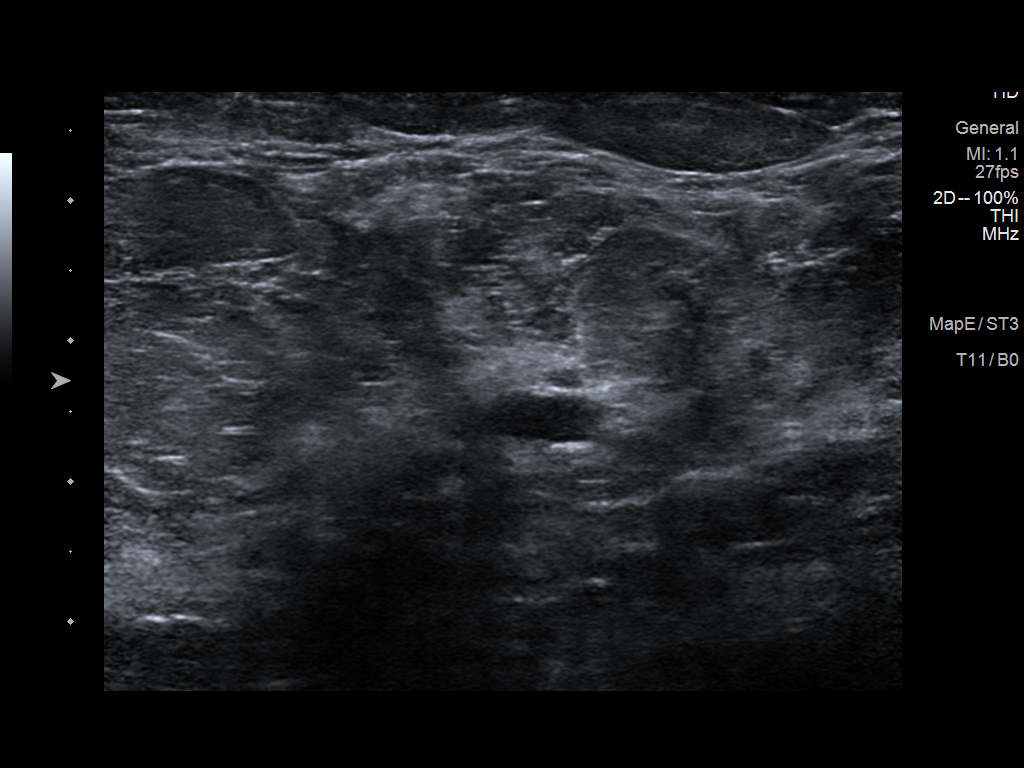
[im 5/7]
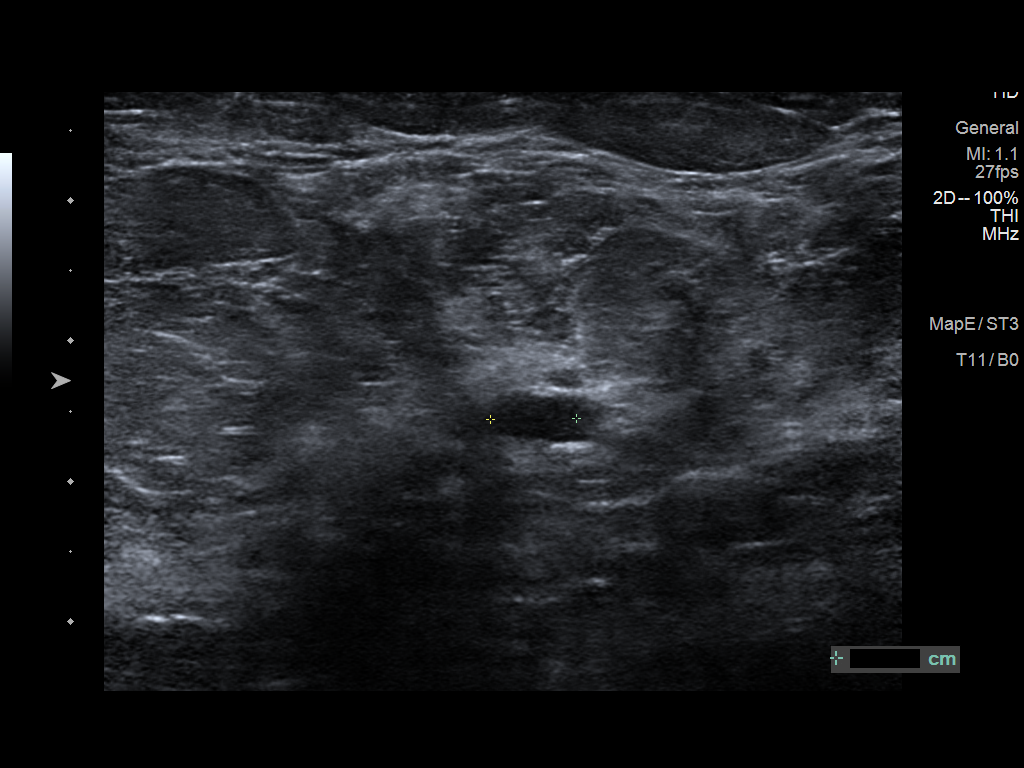
[im 6/7]
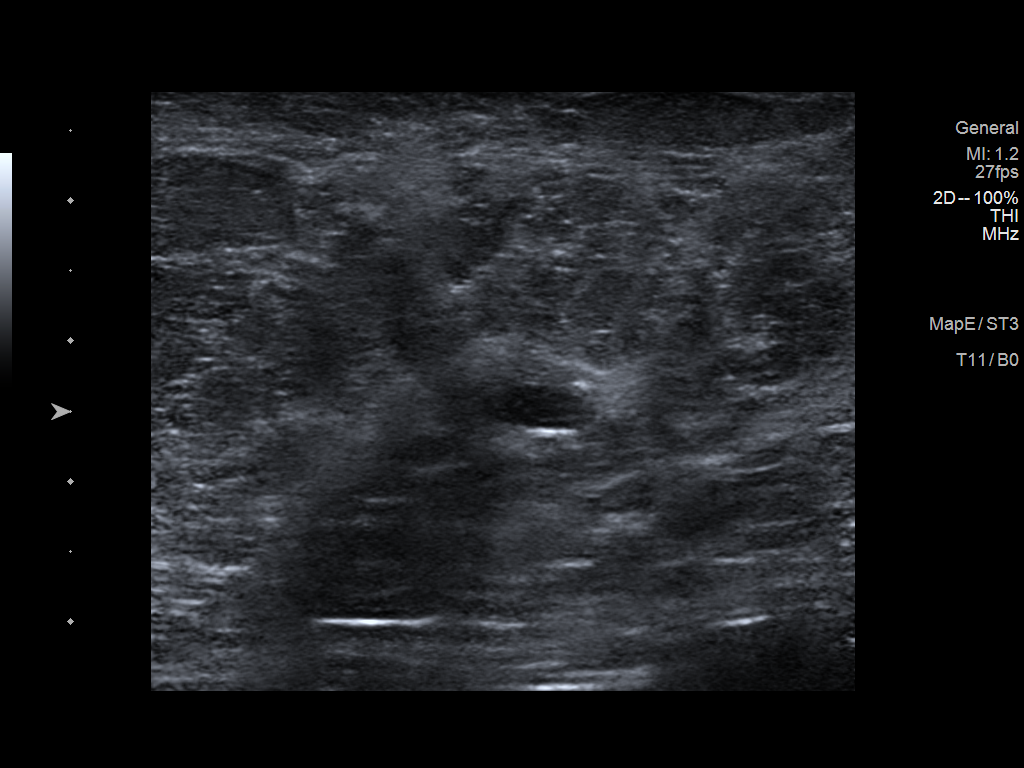
[im 7/7]
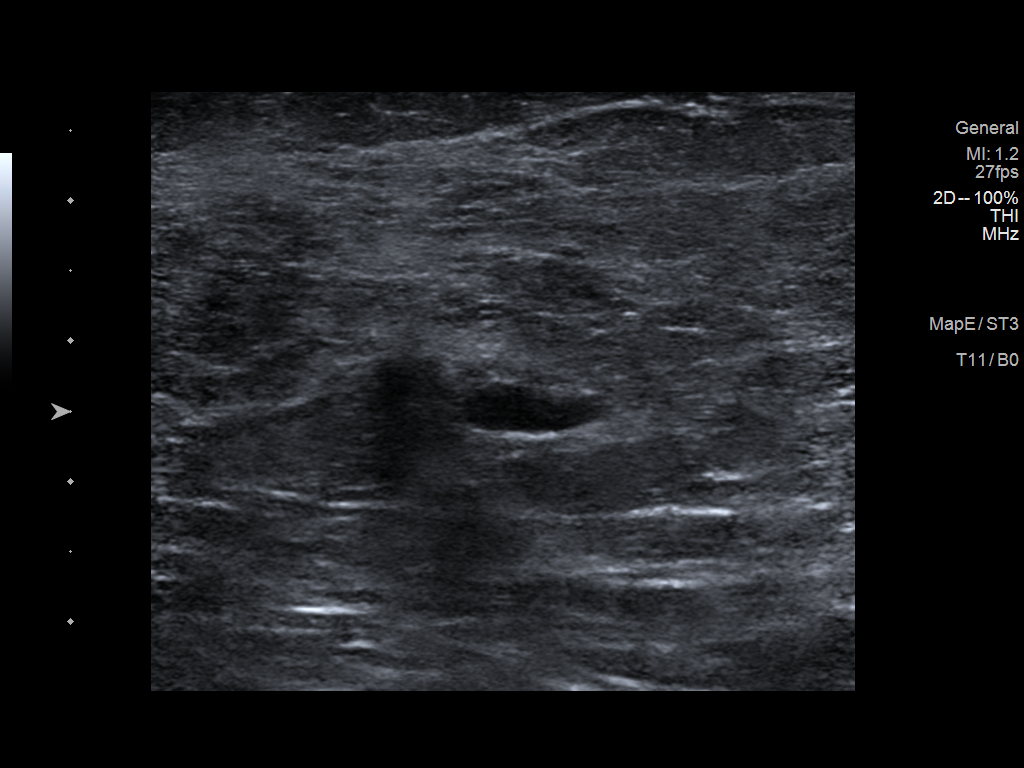

[7 of 7 positions shown; findings below may reference images not displayed]

ACR Breast Density Category b: There are scattered areas of
fibroglandular density.
FINDINGS: The partly obscured mass in the lateral right breast is unchanged.
There are no new masses, no areas of architectural distortion, no
significant areas of asymmetry and no suspicious calcifications.

Targeted right breast ultrasound is performed, showing an oval cyst,
without complicating features, in the right breast at 9 o'clock, 4
cm from the nipple, measuring 10 x 4 x 6 mm. This initially measured
7 x 3 x 6 mm, and is more clearly a simple cyst on the current exam.
IMPRESSION: 1. No evidence of breast malignancy.
2. Benign right breast cyst.

RECOMMENDATION:
Screening mammogram in one year.(Code:SP-Z-G5M)

I have discussed the findings and recommendations with the patient.
If applicable, a reminder letter will be sent to the patient
regarding the next appointment.

BI-RADS CATEGORY  2: Benign.

## 2023-08-17 ENCOUNTER — Telehealth (HOSPITAL_BASED_OUTPATIENT_CLINIC_OR_DEPARTMENT_OTHER): Payer: Self-pay | Admitting: *Deleted

## 2023-08-17 NOTE — Telephone Encounter (Signed)
Pt called to provide update on medication as requested by provider. She reports that after starting the gabapentin, she has been able to sleep through the night. She has not waken with any night sweats. She reports that during the day she can feel that a hot flash may be trying to emerge but it is controlled. Her symptoms were controlled with 200mg  but she did increase to 300mg .  She reports that this past week she has experienced a dull headache almost daily. Yesterday she started experiencing leg cramps in her calf on the same leg that she has the sciatic nerve pain. She says that it comes and goes. She denies redness and heat. She reports swelling in the whole leg but says that leg is always bigger than the other. Advised pt that if symptoms persist/worsen or if she notices heat or redness or increased swelling in the calf, she should go to ED for evaluation to rule out blood clot. Pt verbalized understanding.

## 2023-08-20 NOTE — Telephone Encounter (Signed)
LMOVM for pt to call office to follow up

## 2023-08-21 NOTE — Telephone Encounter (Signed)
Called pt with recommendations. Advised that, since she is getting the same control with 200mg  of gabapentin as 300mg , she should decrease to 200mg  daily to see if this will decrease the headaches and leg cramps. Pt to give update if symptoms do not improve.

## 2023-09-18 ENCOUNTER — Other Ambulatory Visit (HOSPITAL_BASED_OUTPATIENT_CLINIC_OR_DEPARTMENT_OTHER): Payer: Self-pay | Admitting: Obstetrics & Gynecology

## 2023-09-18 DIAGNOSIS — R232 Flushing: Secondary | ICD-10-CM

## 2023-09-18 MED ORDER — GABAPENTIN 100 MG PO CAPS: ORAL_CAPSULE | ORAL | 1 refills | Status: DC

## 2023-11-14 ENCOUNTER — Encounter (HOSPITAL_BASED_OUTPATIENT_CLINIC_OR_DEPARTMENT_OTHER): Payer: Self-pay | Admitting: Emergency Medicine

## 2023-11-14 ENCOUNTER — Other Ambulatory Visit: Payer: Self-pay

## 2023-11-14 ENCOUNTER — Emergency Department (HOSPITAL_BASED_OUTPATIENT_CLINIC_OR_DEPARTMENT_OTHER)
Admission: EM | Admit: 2023-11-14 | Discharge: 2023-11-14 | Disposition: A | Discharge status: Home / Self Care | Attending: Emergency Medicine | Admitting: Emergency Medicine

## 2023-11-14 ENCOUNTER — Telehealth (HOSPITAL_BASED_OUTPATIENT_CLINIC_OR_DEPARTMENT_OTHER): Payer: Self-pay | Admitting: *Deleted

## 2023-11-14 DIAGNOSIS — M79662 Pain in left lower leg: Secondary | ICD-10-CM | POA: Diagnosis present

## 2023-11-14 DIAGNOSIS — Z91010 Allergy to peanuts: Secondary | ICD-10-CM | POA: Diagnosis not present

## 2023-11-14 DIAGNOSIS — M545 Low back pain, unspecified: Secondary | ICD-10-CM | POA: Diagnosis not present

## 2023-11-14 DIAGNOSIS — R200 Anesthesia of skin: Secondary | ICD-10-CM | POA: Diagnosis not present

## 2023-11-14 LAB — D-DIMER, QUANTITATIVE: D-Dimer, Quant: 0.37 ug{FEU}/mL (ref 0.00–0.50)

## 2023-11-14 MED ORDER — KETOROLAC TROMETHAMINE 15 MG/ML IJ SOLN
15.0000 mg | Freq: Once | INTRAMUSCULAR | Status: AC
  Administered 2023-11-14: 15 mg via INTRAMUSCULAR
  Filled 2023-11-14: qty 1

## 2023-11-14 MED ORDER — PREDNISONE 10 MG PO TABS: ORAL_TABLET | ORAL | 0 refills | Status: AC

## 2023-11-14 MED ORDER — DEXAMETHASONE SODIUM PHOSPHATE 10 MG/ML IJ SOLN
10.0000 mg | Freq: Once | INTRAMUSCULAR | Status: AC
  Administered 2023-11-14: 10 mg via INTRAMUSCULAR
  Filled 2023-11-14: qty 1

## 2023-11-14 NOTE — ED Provider Notes (Signed)
 [facility] Provider Note   CSN: [id number] Arrival date & time: 11/14/23  1356     History  Chief Complaint  Patient presents with   leg numbness    left    Kaylee Simon is a 53 y.o. female with history of sciatica, presents with concern for left calf pain that started 3 days ago.  States the pain is a shooting pain in her calf and is also associated with some numbness.  She also reports some pain that shoots up into her lower back.  Denies any weakness in her legs.  Denies any injuries.  HPI     Home Medications Prior to Admission medications   Medication Sig Start Date End Date Taking? Authorizing Provider  predniSONE  (DELTASONE ) 10 MG tablet Take 4 tablets (40 mg total) by mouth daily with breakfast for 2 days, THEN 3 tablets (30 mg total) daily with breakfast for 2 days, THEN 2 tablets (20 mg total) daily with breakfast for 2 days, THEN 1 tablet (10 mg total) daily with breakfast for 1 day. 11/15/23 11/22/23 Yes Rexie Catena, PA-C  cholecalciferol (VITAMIN D3) 25 MCG (1000 UNIT) tablet Take 1,000 Units by mouth daily.    [provider]  Cyanocobalamin (VITAMIN B 12 PO) Take by mouth daily.    [provider]  frovatriptan (FROVA) 2.5 MG tablet Take 2.5 mg by mouth as needed for migraine. If recurs, may repeat after 2 hours. Max of 3 tabs in 24 hours.    [provider]  ibuprofen  (ADVIL ) 800 MG tablet Take 1 tablet (800 mg total) by mouth 3 (three) times daily. 05/09/20   Palumbo, April, MD  montelukast (SINGULAIR) 10 MG tablet Take 10 mg by mouth. Takes 1/2 at hs    [provider]  vitamin E 1000 UNIT capsule Take 1,000 Units by mouth daily.    [provider]      Allergies    Peanuts [peanut oil] and Prunus persica    Review of Systems   Review of Systems  Musculoskeletal:        Left calf pain    Physical Exam Updated Vital Signs BP 136/76   Pulse 72   Temp 98.3 F  (36.8 C)   Resp 20   Wt 93.4 kg   LMP  (Approximate)   SpO2 100%   BMI 36.49 kg/m  Physical Exam Vitals and nursing note reviewed.  Constitutional:      Appearance: Normal appearance.  HENT:     Head: Atraumatic.  Cardiovascular:     Rate and Rhythm: Normal rate and regular rhythm.     Comments: 2+ dorsalis pedis pulses bilaterally Pulmonary:     Effort: Pulmonary effort is normal.  Musculoskeletal:     Comments: Left lower extremity:  General Calves appear symmetric bilaterally.  No obvious deformity. No erythema, edema, contusions, open wounds   Palpation Nontender over the calves bilaterally Non tender over the femur Nontender along the tibia and fibula, patella Nontender on the lateral and medial malleolus Non-tender of the popliteal fossa Nontender to the cervical, thoracic, or lumbar spine.  Nontender to the paraspinal muscles  ROM Full knee flexion and extension, full ankle plantarflexion and dorsiflexion  Sensation: Sensation intact throughout the lower extremity  Strength: 5/5 strength with resisted hip flexion bilaterally 5/5 strength with resisted knee flexion and extension bilaterally 5/5 strength with resisted ankle plantarflexion and dorsiflexion bilaterally   Neurological:     General: No  focal deficit present.     Mental Status: She is alert.  Psychiatric:        Mood and Affect: Mood normal.        Behavior: Behavior normal.     ED Results / Procedures / Treatments   Labs (all labs ordered are listed, but only abnormal results are displayed) Labs Reviewed  D-DIMER, QUANTITATIVE    EKG None  Radiology No results found.  Procedures Procedures    Medications Ordered in ED Medications  ketorolac  (TORADOL ) 15 MG/ML injection 15 mg (15 mg Intramuscular Given 11/14/23 1811)  dexamethasone  (DECADRON ) injection 10 mg (10 mg Intramuscular Given 11/14/23 1811)    ED Course/ Medical Decision Making/ A&P                                  Medical Decision Making Amount and/or Complexity of Data Reviewed Labs: ordered.  Risk Prescription drug management.     Differential diagnosis includes but is not limited to fracture, sprain, DVT, radiculopathy  ED Course:  Upon initial evaluation, patient is well-appearing, stable vital signs.  On exam, calf appears symmetric bilaterally.  No overlying skin changes or wounds to suggest infection.  She is neurovascularly intact in the bilateral lower extremities.  D-dimer within normal limits, low concern for DVT at this time.  Full range of motion of the hip, knee, and ankles bilaterally with 5/5 strength.  Denies any injuries to the area, no indication for x-ray imaging is a low concern for acute bony abnormality.  Feel her symptoms are consistent with radiculopathy pain as she describes it as a shooting goes down her leg.  Labs Ordered: I Ordered, and personally interpreted labs.  The pertinent results include:   D-dimer within normal limits Medications Given: Toradol  and Decadron  given for pain   Impression: Left calf pain, likely radiculopathy  Disposition:  The patient was discharged home with instructions to take course of prednisone  as prescribed.  Ibuprofen  as needed for pain.  Follow-up with PCP for recheck within the next week. Return precautions given.    This chart was dictated using voice recognition software, Dragon. Despite the best efforts of this provider to proofread and correct errors, errors may still occur which can change documentation meaning.          Final Clinical Impression(s) / ED Diagnoses Final diagnoses:  Pain of left calf    Rx / DC Orders ED Discharge Orders          Ordered    predniSONE  (DELTASONE ) 10 MG tablet  Q breakfast        11/14/23 1805              Rexie Catena, PA-C 11/14/23 1829    Owen Blowers P, DO 11/18/23 1427

## 2023-11-14 NOTE — ED Triage Notes (Signed)
 Left leg numbness and tingling x 3 days , Hx back issues , had epidural injection last year for back pain . Alert and oriented x 4 , ambulatory to triage room with steady gait , no further neuro deficit

## 2023-11-14 NOTE — Telephone Encounter (Signed)
 Pt called with concerns with issues of pain in her leg, mostly in the calf. She reports a heavy, tight feeling. Pt reports that her left leg is bigger than her right leg. She has been taking gabapentin and was told to call if she developed any symptoms of a blood clot as this is a rare side effect. Advised pt that Dr. Annabell Key is out of the office this week and she should call PCP to be seen or go to Urgent care for evaluation. Advised that she should also stop taking the gabapentin. Pt verbalized understanding.

## 2023-11-14 NOTE — Discharge Instructions (Addendum)
 Your lab work did not show any signs of a blood clot today.  The pain in your leg is likely due to nerve irritation.  You may use up to 600mg  ibuprofen every 6 hours as needed for pain.  Do not exceed 2.4g of ibuprofen per day.  This is an over the counter medication. You were given your first dose here today, your next dose can be no sooner than midnight tonight.  You have been prescribed prednisone. Please take this medication as prescribed for the next 7 days starting tomorrow morning (40mg  on days 1 and 2, 30mg  on days 3 and 4, 20mg  on days 5 and 6, 10mg  on day 7). Take this medication in the morning with breakfast, as taking it at night may make it hard to sleep. If you are a diabetic, please monitor your blood sugars closely on this medication, as it can cause your blood sugar to rise.  Please follow-up with your PCP for recheck of your symptoms within the next week.  Return to the ER for any complete numbness of your leg or foot, difficulty walking, fevers, any other new or concerning symptoms.

## 2023-11-28 ENCOUNTER — Telehealth (HOSPITAL_BASED_OUTPATIENT_CLINIC_OR_DEPARTMENT_OTHER): Admitting: Obstetrics & Gynecology

## 2023-11-28 DIAGNOSIS — R232 Flushing: Secondary | ICD-10-CM | POA: Diagnosis not present

## 2023-11-28 DIAGNOSIS — R7303 Prediabetes: Secondary | ICD-10-CM | POA: Diagnosis not present

## 2023-11-28 MED ORDER — DULOXETINE HCL 30 MG PO CPEP: 30.0000 mg | ORAL_CAPSULE | Freq: Every day | ORAL | 1 refills | Status: DC

## 2023-11-28 NOTE — Progress Notes (Signed)
 Virtual Visit via Video Note  I connected with Kaylee Simon on 11/30/23 at  3:35 PM EDT by a video enabled telemedicine application and verified that I am speaking with the correct person using two identifiers.  Location: Patient: home Provider: office   I discussed the limitations of evaluation and management by telemedicine and the availability of in person appointments. The patient expressed understanding and agreed to proceed.  History of Present Illness: 53 yo G0 MAAF with h/o menopausal symptoms, and specifically hot flashes, that has been on going for several months.  She was seen 07/31/2023 to discus this.  Frustrated with weight gain.  We started gabapentin  in December for this. Has stopped gabapentin  as feels contributed to leg pain that she ultimately sought emergent care for on 11/14/2023.  D dimer was done.  No swelling.  Pain is better.  No h/o DVT.  She knows this is not a common side effect of medication but did stop it.  She is still having hot flashes.  Would like to discuss options.  She does have hx of migraines but no aura.  Discussed Veozah, SSRI or SNRI use and HRT use.  Risks/benefits discussed.  She does want to do some research but would like to consider Cymbalta  rx.  I think this could help with leg pain as well.  Is having additional evaluation for this.     Observations/Objective: WNWD, AAF with NAD  Assessment and Plan: 1. Hot flashes (Primary) - will start cymbalta  30mg  daily.  Plan to recheck or pt give update in 1 months - DULoxetine  (CYMBALTA ) 30 MG capsule; Take 1 capsule (30 mg total) by mouth daily.  Dispense: 30 capsule; Refill: 1  2. Prediabetes  3. Serum calcium elevated - reviewed last work and pt will return for repeat blood work next week.  Orders placed and lab appt scheduled - Hemoglobin A1c; Future - Comprehensive metabolic panel with GFR; Future  Follow Up Instructions: I discussed the assessment and treatment plan with the patient. The  patient was provided an opportunity to ask questions and all were answered. The patient agreed with the plan and demonstrated an understanding of the instructions.   The patient was advised to call back or seek an in-person evaluation if the symptoms worsen or if the condition fails to improve as anticipated.  I provided 22 minutes of non-face-to-face time during this encounter.   Lillian Rein, MD

## 2023-11-30 ENCOUNTER — Encounter (HOSPITAL_BASED_OUTPATIENT_CLINIC_OR_DEPARTMENT_OTHER): Payer: Self-pay | Admitting: Obstetrics & Gynecology

## 2023-12-05 ENCOUNTER — Other Ambulatory Visit (HOSPITAL_COMMUNITY): Payer: Self-pay | Admitting: Family Medicine

## 2023-12-05 ENCOUNTER — Other Ambulatory Visit: Payer: Self-pay | Admitting: Obstetrics & Gynecology

## 2023-12-06 LAB — COMPREHENSIVE METABOLIC PANEL WITH GFR
ALT: 20 IU/L (ref 0–32)
AST: 17 IU/L (ref 0–40)
Albumin: 4.4 g/dL (ref 3.8–4.9)
Alkaline Phosphatase: 79 IU/L (ref 44–121)
BUN/Creatinine Ratio: 16 (ref 9–23)
BUN: 15 mg/dL (ref 6–24)
Bilirubin Total: 0.6 mg/dL (ref 0.0–1.2)
CO2: 19 mmol/L — ABNORMAL LOW (ref 20–29)
Calcium: 9.8 mg/dL (ref 8.7–10.2)
Chloride: 109 mmol/L — ABNORMAL HIGH (ref 96–106)
Creatinine, Ser: 0.92 mg/dL (ref 0.57–1.00)
Globulin, Total: 2.2 g/dL (ref 1.5–4.5)
Glucose: 107 mg/dL — ABNORMAL HIGH (ref 70–99)
Potassium: 4.4 mmol/L (ref 3.5–5.2)
Sodium: 142 mmol/L (ref 134–144)
Total Protein: 6.6 g/dL (ref 6.0–8.5)
eGFR: 75 mL/min/{1.73_m2} (ref >59)

## 2023-12-06 LAB — HEMOGLOBIN A1C
Est. average glucose Bld gHb Est-mCnc: 114 mg/dL
Hgb A1c MFr Bld: 5.6 % (ref 4.8–5.6)

## 2023-12-21 ENCOUNTER — Other Ambulatory Visit: Payer: Self-pay | Admitting: Obstetrics & Gynecology

## 2023-12-21 DIAGNOSIS — Z1231 Encounter for screening mammogram for malignant neoplasm of breast: Secondary | ICD-10-CM

## 2024-01-16 ENCOUNTER — Ambulatory Visit (HOSPITAL_BASED_OUTPATIENT_CLINIC_OR_DEPARTMENT_OTHER): Payer: Self-pay | Admitting: Obstetrics & Gynecology

## 2024-01-19 ENCOUNTER — Other Ambulatory Visit (HOSPITAL_BASED_OUTPATIENT_CLINIC_OR_DEPARTMENT_OTHER): Payer: Self-pay | Admitting: Obstetrics & Gynecology

## 2024-01-19 DIAGNOSIS — R232 Flushing: Secondary | ICD-10-CM

## 2024-01-21 NOTE — Telephone Encounter (Signed)
 Pt has annual exam in Sept 2025.  Refill given until then. S[place]ero CMA

## 2024-02-15 ENCOUNTER — Encounter: Payer: Self-pay | Admitting: Advanced Practice Midwife

## 2024-03-25 ENCOUNTER — Other Ambulatory Visit (HOSPITAL_BASED_OUTPATIENT_CLINIC_OR_DEPARTMENT_OTHER): Payer: Self-pay | Admitting: Obstetrics & Gynecology

## 2024-03-25 DIAGNOSIS — R232 Flushing: Secondary | ICD-10-CM

## 2024-04-09 ENCOUNTER — Encounter (HOSPITAL_BASED_OUTPATIENT_CLINIC_OR_DEPARTMENT_OTHER): Payer: Self-pay

## 2024-04-11 ENCOUNTER — Encounter (HOSPITAL_BASED_OUTPATIENT_CLINIC_OR_DEPARTMENT_OTHER): Payer: Self-pay | Admitting: Obstetrics & Gynecology

## 2024-04-11 ENCOUNTER — Other Ambulatory Visit (HOSPITAL_BASED_OUTPATIENT_CLINIC_OR_DEPARTMENT_OTHER): Payer: Self-pay

## 2024-04-11 ENCOUNTER — Ambulatory Visit (INDEPENDENT_AMBULATORY_CARE_PROVIDER_SITE_OTHER): Admitting: Obstetrics & Gynecology

## 2024-04-11 VITALS — BP 130/78 | HR 74 | Ht 63.0 in | Wt 180.0 lb

## 2024-04-11 DIAGNOSIS — Z01419 Encounter for gynecological examination (general) (routine) without abnormal findings: Secondary | ICD-10-CM

## 2024-04-11 DIAGNOSIS — Z1331 Encounter for screening for depression: Secondary | ICD-10-CM

## 2024-04-11 DIAGNOSIS — G43009 Migraine without aura, not intractable, without status migrainosus: Secondary | ICD-10-CM | POA: Diagnosis not present

## 2024-04-11 DIAGNOSIS — R232 Flushing: Secondary | ICD-10-CM

## 2024-04-11 DIAGNOSIS — N9089 Other specified noninflammatory disorders of vulva and perineum: Secondary | ICD-10-CM | POA: Diagnosis not present

## 2024-04-11 DIAGNOSIS — Z6832 Body mass index (BMI) 32.0-32.9, adult: Secondary | ICD-10-CM

## 2024-04-11 MED ORDER — COVID-19 MRNA VACCINE (PFIZER) 30 MCG/0.3ML IM SUSP: 0.3000 mL | Freq: Once | INTRAMUSCULAR | 0 refills | Status: AC

## 2024-04-11 MED ORDER — DULOXETINE HCL 30 MG PO CPEP: 30.0000 mg | ORAL_CAPSULE | Freq: Every day | ORAL | 3 refills | Status: AC

## 2024-04-11 NOTE — Progress Notes (Signed)
 ANNUAL EXAM Patient name: Kaylee Simon MRN [id number]  Date of birth: 1970-08-19 Chief Complaint:   Annual Exam  History of Present Illness:   Kaylee Simon is a 53 y.o. G0P0000 African-American female being seen today for a routine annual exam.  Is taking Duloxetine  30mg  daily.  This has helped a lot with her hot flashes.  Had to restart Topiramate this year due to her migraines.  These are under good control right now.     No LMP recorded. (Menstrual status: Perimenopausal).  Last pap 04/10/2023. Results were: NILM w/ HRHPV negative. H/O abnormal pap: no Last mammogram: 04/17/2023. Results were: normal. Family h/o breast cancer: no.  Next mammogram scheduled 04/17/2024. Last colonoscopy: 12/23/2015. Results were: normal. Family h/o colorectal cancer: no     04/11/2024    8:13 AM 11/28/2023    3:29 PM 07/31/2023    1:50 PM 04/10/2023    9:05 AM 07/20/2022    9:58 AM  Depression screen PHQ 2/9  Decreased Interest 0 0 0 0 0  Down, Depressed, Hopeless 0 0 0 0 0  PHQ - 2 Score 0 0 0 0 0     Review of Systems:   Pertinent items are noted in HPI Denies any bladder or bowel changes.  Denies pelvic pain.   Pertinent History Reviewed:  Reviewed past medical,surgical, social and family history.  Reviewed problem list, medications and allergies. Physical Assessment:   Vitals:   04/11/24 0800 04/11/24 0815  BP: (!) 147/95 130/78  Pulse: 74   SpO2: 100%   Weight: 180 lb (81.6 kg)   Height: 5' 3 (1.6 m)   Body mass index is 31.89 kg/m.        Physical Examination:   General appearance - well appearing, and in no distress  Mental status - alert, oriented to person, place, and time  Psych:  She has a normal mood and affect  Skin - warm and dry, normal color, no suspicious lesions noted  Chest - effort normal, all lung fields clear to auscultation bilaterally  Heart - normal rate and regular rhythm  Neck:  midline trachea, no thyromegaly or nodules  Breasts - breasts appear normal,  no suspicious masses, no skin or nipple changes or  axillary nodes  Abdomen - soft, nontender, nondistended, no masses or organomegaly  Pelvic - VULVA: normal appearing vulva with no masses, no tenderness.  Stable left inner labia majora pigmented lesion measuring 2 x 5mm, stable   VAGINA: normal appearing vagina with normal color and discharge, no lesions   CERVIX: normal appearing cervix without discharge or lesions, no CMT  Thin prep pap is not indicated  UTERUS: uterus is felt to be normal size, shape, consistency and nontender   ADNEXA: No adnexal masses or tenderness noted.  Rectal - normal rectal, good sphincter tone, no masses felt  Extremities:  No swelling or varicosities noted  Chaperone present for exam  No results found for this or any previous visit (from the past 24 hours).  Assessment & Plan:  1. Well woman exam with routine gynecological exam (Primary) - Pap smear neg in 2024 - Mammogram 2024.  Scheduled already for the fall - Colonoscopy 2017.  Follow up 10 years.  - lab work done with PCP - vaccines reviewed/updated  2. Hot flashes - Doing well with Duloxetine  - DULoxetine  (CYMBALTA ) 30 MG capsule; Take 1 capsule (30 mg total) by mouth daily.  Dispense: 90 capsule; Refill: 3  3. Labial lesion - stable exam today  4. Migraine without aura and without status migrainosus, not intractable  5. BMI 32.0-32.9,adult - pt would like Covid vaccination.  Order placed but cannot print rx for pt at this time.   - COVID-19 mRNA vaccine, Pfizer, 30 MCG/0.3ML injection; Inject 0.3 mLs into the muscle once for 1 dose.  Dispense: 0.3 mL; Refill: 0   No orders of the defined types were placed in this encounter.   Meds:  Meds ordered this encounter  Medications   DULoxetine  (CYMBALTA ) 30 MG capsule    Sig: Take 1 capsule (30 mg total) by mouth daily.    Dispense:  90 capsule    Refill:  3   COVID-19 mRNA vaccine, Pfizer, 30 MCG/0.3ML injection    Sig: Inject 0.3 mLs into  the muscle once for 1 dose.    Dispense:  0.3 mL    Refill:  0    Follow-up: Return in about 1 year (around 04/11/2025).  Ronal GORMAN Pinal, MD 04/11/2024 8:55 AM

## 2024-04-17 ENCOUNTER — Ambulatory Visit
Admission: RE | Admit: 2024-04-17 | Discharge: 2024-04-17 | Disposition: A | Discharge status: Home / Self Care | Source: Ambulatory Visit | Attending: Obstetrics & Gynecology | Admitting: Obstetrics & Gynecology

## 2024-04-17 DIAGNOSIS — Z1231 Encounter for screening mammogram for malignant neoplasm of breast: Secondary | ICD-10-CM

## 2024-05-16 ENCOUNTER — Other Ambulatory Visit (HOSPITAL_BASED_OUTPATIENT_CLINIC_OR_DEPARTMENT_OTHER): Payer: Self-pay

## 2024-05-16 MED ORDER — COMIRNATY 30 MCG/0.3ML IM SUSY
0.3000 mL | PREFILLED_SYRINGE | Freq: Once | INTRAMUSCULAR | 0 refills | Status: AC
  Filled 2024-05-16: qty 0.3, 1d supply, fill #0

## 2024-09-03 ENCOUNTER — Telehealth (HOSPITAL_BASED_OUTPATIENT_CLINIC_OR_DEPARTMENT_OTHER): Payer: Self-pay

## 2024-09-03 NOTE — Telephone Encounter (Signed)
 Patient left message on voicemail that she is having pain. Returned call to patient and left message requesting patient call office back at [phone].  Morna LOISE Quale, RN

## 2024-09-04 ENCOUNTER — Telehealth (HOSPITAL_BASED_OUTPATIENT_CLINIC_OR_DEPARTMENT_OTHER): Payer: Self-pay

## 2024-09-04 NOTE — Telephone Encounter (Signed)
 Spoke with patient. Patient states that she has had ongoing pelvic pain for 2 weeks. Reports pain is radiating from her anus to her vagina and vice versa. Reports pain is coming when she lays down, stands up, or turns. Denies any vaginal discharge, fever or chills. Unable to have intercourse due to lack of interest due to pain. Reports urinary frequency. Advised will need to be seen in office for evaluation of possible UTI and be seen with a physician for further evaluation. Patient is agreeable. Nurse visit scheduled for 09/05/2024 at 11 am. Appointment scheduled with Dr.Miller for 09/17/2024 at 2:55 pm. Patient is agreeable to dates and times.

## 2024-09-05 ENCOUNTER — Encounter (HOSPITAL_BASED_OUTPATIENT_CLINIC_OR_DEPARTMENT_OTHER): Payer: Self-pay

## 2024-09-05 ENCOUNTER — Ambulatory Visit (HOSPITAL_BASED_OUTPATIENT_CLINIC_OR_DEPARTMENT_OTHER)

## 2024-09-05 VITALS — BP 132/79 | HR 70 | Wt 174.0 lb

## 2024-09-05 DIAGNOSIS — R319 Hematuria, unspecified: Secondary | ICD-10-CM

## 2024-09-05 DIAGNOSIS — R350 Frequency of micturition: Secondary | ICD-10-CM

## 2024-09-05 LAB — POCT URINALYSIS DIPSTICK
Bilirubin, UA: NEGATIVE
Glucose, UA: NEGATIVE
Ketones, UA: NEGATIVE
Leukocytes, UA: NEGATIVE
Nitrite, UA: NEGATIVE
Protein, UA: NEGATIVE
Spec Grav, UA: >=1.03 — AB
Urobilinogen, UA: 0.2 U/dL
pH, UA: 5.5

## 2024-09-05 NOTE — Progress Notes (Signed)
 NURSE VISIT- UTI SYMPTOMS   SUBJECTIVE:  Kaylee Simon is a 54 y.o. G0P0000 female here for UTI symptoms. She is a GYN patient. She reports hematuria and urinary frequency.  OBJECTIVE:  BP 132/79   Pulse 70   Wt 174 lb (78.9 kg)   BMI 30.82 kg/m   Appears well, in no apparent distress  No results found for this or any previous visit (from the past 24 hours).  ASSESSMENT: GYN patient with UTI symptoms and negative nitrites  PLAN: Visit routed to or discussed with:  Rx sent today: No Urine culture sent Call or return to clinic prn if these symptoms worsen or fail to improve as anticipated. Follow-up: as needed

## 2024-09-17 ENCOUNTER — Ambulatory Visit (HOSPITAL_BASED_OUTPATIENT_CLINIC_OR_DEPARTMENT_OTHER): Payer: Self-pay | Admitting: Obstetrics & Gynecology
